# Patient Record
Sex: Female | Born: 1985 | Race: Black or African American | Marital: Single | Smoking: Never smoker
Health system: Southern US, Community
[De-identification: ages and names within clinical notes are randomized; demographics above are authoritative.]

## PROBLEM LIST (undated history)

## (undated) DIAGNOSIS — O1490 Unspecified pre-eclampsia, unspecified trimester: Secondary | ICD-10-CM

## (undated) HISTORY — PX: NO PAST SURGERIES: SHX2092

## (undated) HISTORY — DX: Unspecified pre-eclampsia, unspecified trimester: O14.90

---

## 2015-01-11 NOTE — L&D Delivery Note (Signed)
Delivery Note At 2:07 AM a viable female was delivered via Vaginal, Spontaneous Delivery (Presentation:OA ). Mother was induced with cytotec & pitocin.   APGAR: 8,9.  Placenta status: delivered spontaneously intact. Cord: 3 vessel, without complications.  Anesthesia:  Epidural   Episiotomy: None Lacerations: 2nd degree, perineal Suture Repair: 3.0 Est. Blood Loss (mL):100    Mom to postpartum.  Baby to Couplet care / Skin to Skin. All instruments and gauze accounted for and sharps disposed of.  Andres Ege, MD, PGY-1, MPH 08/15/2015, 2:27 AM   OB FELLOW DELIVERY ATTESTATION  I was gloved and present for the delivery in its entirety, and I agree with the above resident's note.    Ernestina Penna, MD 6:23 AM

## 2015-05-20 ENCOUNTER — Encounter: Payer: Self-pay | Admitting: General Practice

## 2015-05-20 ENCOUNTER — Encounter: Payer: Self-pay | Admitting: Advanced Practice Midwife

## 2015-05-20 ENCOUNTER — Ambulatory Visit (INDEPENDENT_AMBULATORY_CARE_PROVIDER_SITE_OTHER): Payer: Self-pay | Admitting: Advanced Practice Midwife

## 2015-05-20 VITALS — BP 137/84 | HR 122 | Ht 63.0 in

## 2015-05-20 DIAGNOSIS — Z124 Encounter for screening for malignant neoplasm of cervix: Secondary | ICD-10-CM

## 2015-05-20 DIAGNOSIS — Z113 Encounter for screening for infections with a predominantly sexual mode of transmission: Secondary | ICD-10-CM

## 2015-05-20 DIAGNOSIS — O162 Unspecified maternal hypertension, second trimester: Secondary | ICD-10-CM

## 2015-05-20 DIAGNOSIS — Z3689 Encounter for other specified antenatal screening: Secondary | ICD-10-CM

## 2015-05-20 DIAGNOSIS — Z3492 Encounter for supervision of normal pregnancy, unspecified, second trimester: Secondary | ICD-10-CM

## 2015-05-20 DIAGNOSIS — O0990 Supervision of high risk pregnancy, unspecified, unspecified trimester: Secondary | ICD-10-CM | POA: Insufficient documentation

## 2015-05-20 LAB — POCT URINALYSIS DIP (DEVICE)
Bilirubin Urine: NEGATIVE
GLUCOSE, UA: NEGATIVE mg/dL
Ketones, ur: NEGATIVE mg/dL
NITRITE: NEGATIVE
PROTEIN: NEGATIVE mg/dL
SPECIFIC GRAVITY, URINE: 1.025 (ref 1.005–1.030)
UROBILINOGEN UA: 0.2 mg/dL (ref 0.0–1.0)
pH: 6.5 (ref 5.0–8.0)

## 2015-05-20 LAB — COMPREHENSIVE METABOLIC PANEL
ALK PHOS: 58 U/L (ref 33–115)
ALT: 17 U/L (ref 6–29)
AST: 16 U/L (ref 10–30)
Albumin: 3.7 g/dL (ref 3.6–5.1)
BILIRUBIN TOTAL: 0.2 mg/dL (ref 0.2–1.2)
BUN: 6 mg/dL — AB (ref 7–25)
CO2: 24 mmol/L (ref 20–31)
CREATININE: 0.65 mg/dL (ref 0.50–1.10)
Calcium: 9.2 mg/dL (ref 8.6–10.2)
Chloride: 104 mmol/L (ref 98–110)
Glucose, Bld: 119 mg/dL — ABNORMAL HIGH (ref 65–99)
Potassium: 4.3 mmol/L (ref 3.5–5.3)
Sodium: 138 mmol/L (ref 135–146)
Total Protein: 6.8 g/dL (ref 6.1–8.1)

## 2015-05-20 LAB — GLUCOSE TOLERANCE, 1 HOUR (50G) W/O FASTING: Glucose, 1 Hr, gestational: 120 mg/dL (ref <140)

## 2015-05-20 NOTE — Progress Notes (Signed)
New ob packet given  Early glucola given due to BMI > 30 Anatomy May 18th @ 1400.  Pt notified.

## 2015-05-20 NOTE — Progress Notes (Signed)
   Subjective:    Jacqueline [name] is a G1P0 37w1dbeing seen today for her first obstetrical visit.  Her obstetrical history is significant for none G1.  [place] Pregnancy history fully reviewed.  Patient reports no complaints.  Filed Vitals:   05/20/15 0818 05/20/15 0820 05/20/15 0823 05/20/15 0905  BP: 147/94  148/89 137/84  Pulse: 122     Height:  '5\' 3"'$  (1.6 m)      HISTORY: OB History  Gravida Para Term Preterm AB SAB TAB Ectopic Multiple Living  1             # Outcome Date GA Lbr Len/2nd Weight Sex Delivery Anes PTL Lv  1 Current              History reviewed. No pertinent past medical history. History reviewed. No pertinent past surgical history. History reviewed. No pertinent family history.   Exam    Uterus:  Fundal Height: 25 cm  Pelvic Exam:    Perineum: No Hemorrhoids, Normal Perineum   Vulva: normal   Vagina:  normal mucosa, normal discharge   pH:    Cervix: no lesions and nulliparous appearance   Adnexa: normal adnexa and no mass, fullness, tenderness   Bony Pelvis: average  System: Breast:  normal appearance, no masses or tenderness   Skin: normal coloration and turgor, no rashes    Neurologic: oriented, normal, normal mood, gait normal; reflexes normal and symmetric   Extremities: normal strength, tone, and muscle mass, ROM of all joints is normal   HEENT sclera clear, anicteric, neck supple with midline trachea and thyroid without masses   Mouth/Teeth mucous membranes moist, pharynx normal without lesions and dental hygiene good   Neck supple and no masses   Cardiovascular:    Respiratory:  appears well, vitals normal, no respiratory distress, acyanotic, normal RR, ear and throat exam is normal, neck free of mass or lymphadenopathy   Abdomen: soft, non-tender; bowel sounds normal; no masses,  no organomegaly   Urinary: urethral meatus normal      Assessment:    Pregnancy: G1P0  1. Supervision  of low-risk pregnancy, second trimester  - Glucose Tolerance, 1 HR (50g) w/o Fasting - Prenatal Profile - Prescript Monitor Profile(19) - Cytology - PAP - GC/Chlamydia probe amp ([place])not at [facility] 2. Encounter for fetal anatomic survey  - [place] OB COMP + 14 WK; Future  3. Hypertension in pregnancy, antepartum, second trimester --Transient, with 2 elevated BPs, normal BP on retake.  Pt denies h/a, epigastric pain, visual disturbances. Labs drawn, preeclampsia precautions given.   --BP check in 2 weeks, ROB in 4 weeks if normal. - Comp Met (CMET) - Protein / creatinine ratio, urine      Plan:     Initial labs drawn. Prenatal vitamins. Problem list reviewed and updated. Genetic Screening discussed : late.  Ultrasound discussed; fetal survey: ordered.  Follow up in 2 weeks for BP check, 4 weeks for ROB    LEFTWICH-KIRBY, Yaritsa Savarino 05/20/2015

## 2015-05-20 NOTE — Patient Instructions (Addendum)

## 2015-05-21 LAB — PRENATAL PROFILE (SOLSTAS)
Antibody Screen: NEGATIVE
Basophils Absolute: 0 cells/uL (ref 0–200)
Basophils Relative: 0 %
Eosinophils Absolute: 144 cells/uL (ref 15–500)
Eosinophils Relative: 2 %
HCT: 34.9 % — ABNORMAL LOW (ref 35.0–45.0)
HIV: NONREACTIVE
Hemoglobin: 11.9 g/dL (ref 11.7–15.5)
Hepatitis B Surface Ag: NEGATIVE
LYMPHS PCT: 22 %
Lymphs Abs: 1584 cells/uL (ref 850–3900)
MCH: 29.7 pg (ref 27.0–33.0)
MCHC: 34.1 g/dL (ref 32.0–36.0)
MCV: 87 fL (ref 80.0–100.0)
MONO ABS: 504 {cells}/uL (ref 200–950)
MONOS PCT: 7 %
MPV: 9.1 fL (ref 7.5–12.5)
NEUTROS PCT: 69 %
Neutro Abs: 4968 cells/uL (ref 1500–7800)
PLATELETS: 226 10*3/uL (ref 140–400)
RBC: 4.01 MIL/uL (ref 3.80–5.10)
RDW: 14 % (ref 11.0–15.0)
RH TYPE: POSITIVE
Rubella: <0.9 Index (ref <0.90)
WBC: 7.2 10*3/uL (ref 3.8–10.8)

## 2015-05-21 LAB — GC/CHLAMYDIA PROBE AMP (~~LOC~~) NOT AT ARMC
Chlamydia: NEGATIVE
NEISSERIA GONORRHEA: NEGATIVE

## 2015-05-21 LAB — PROTEIN / CREATININE RATIO, URINE
CREATININE, URINE: 190 mg/dL (ref 20–320)
Protein Creatinine Ratio: 174 mg/g creat — ABNORMAL HIGH (ref 21–161)
Total Protein, Urine: 33 mg/dL — ABNORMAL HIGH (ref 5–24)

## 2015-05-21 LAB — CYTOLOGY - PAP

## 2015-05-22 LAB — PRESCRIPTION MONITORING PROFILE (19 PANEL)
Amphetamine/Meth: NEGATIVE ng/mL
BENZODIAZEPINE SCREEN, URINE: NEGATIVE ng/mL
Barbiturate Screen, Urine: NEGATIVE ng/mL
Buprenorphine, Urine: NEGATIVE ng/mL
CANNABINOID SCRN UR: NEGATIVE ng/mL
COCAINE METABOLITES: NEGATIVE ng/mL
CREATININE, URINE: 186.15 mg/dL (ref >20.0)
Carisoprodol, Urine: NEGATIVE ng/mL
ECSTASY: NEGATIVE ng/mL
FENTANYL URINE: NEGATIVE ng/mL
Meperidine, Ur: NEGATIVE ng/mL
Methadone Screen, Urine: NEGATIVE ng/mL
Methaqualone: NEGATIVE ng/mL
Nitrites, Initial: NEGATIVE ug/mL
OXYCODONE SCRN UR: NEGATIVE ng/mL
Opiate Screen, Urine: NEGATIVE ng/mL
PH URINE, INITIAL: 7 pH (ref 4.5–8.9)
PHENCYCLIDINE, UR: NEGATIVE ng/mL
Propoxyphene: NEGATIVE ng/mL
TRAMADOL UR: NEGATIVE ng/mL
Tapentadol, urine: NEGATIVE ng/mL
ZOLPIDEM, URINE: NEGATIVE ng/mL

## 2015-05-27 ENCOUNTER — Encounter: Payer: Self-pay | Admitting: Obstetrics and Gynecology

## 2015-05-27 DIAGNOSIS — Z283 Underimmunization status: Secondary | ICD-10-CM | POA: Insufficient documentation

## 2015-05-27 DIAGNOSIS — O9989 Other specified diseases and conditions complicating pregnancy, childbirth and the puerperium: Secondary | ICD-10-CM

## 2015-05-27 DIAGNOSIS — Z2839 Other underimmunization status: Secondary | ICD-10-CM | POA: Insufficient documentation

## 2015-05-28 ENCOUNTER — Ambulatory Visit (HOSPITAL_COMMUNITY)
Admission: RE | Admit: 2015-05-28 | Discharge: 2015-05-28 | Disposition: A | Discharge status: Home / Self Care | Payer: Self-pay | Source: Ambulatory Visit | Attending: Advanced Practice Midwife | Admitting: Advanced Practice Midwife

## 2015-05-28 DIAGNOSIS — Z3689 Encounter for other specified antenatal screening: Secondary | ICD-10-CM

## 2015-05-28 DIAGNOSIS — Z36 Encounter for antenatal screening of mother: Secondary | ICD-10-CM | POA: Insufficient documentation

## 2015-05-28 DIAGNOSIS — Z3A26 26 weeks gestation of pregnancy: Secondary | ICD-10-CM | POA: Insufficient documentation

## 2015-05-28 DIAGNOSIS — Z3492 Encounter for supervision of normal pregnancy, unspecified, second trimester: Secondary | ICD-10-CM

## 2015-06-03 ENCOUNTER — Ambulatory Visit (INDEPENDENT_AMBULATORY_CARE_PROVIDER_SITE_OTHER): Payer: Self-pay | Admitting: *Deleted

## 2015-06-03 VITALS — BP 124/82 | HR 103

## 2015-06-03 DIAGNOSIS — O132 Gestational [pregnancy-induced] hypertension without significant proteinuria, second trimester: Secondary | ICD-10-CM

## 2015-06-03 NOTE — Progress Notes (Signed)
Pt denies H/A, visual disturbances or epigastric pain. Pt advised to come to hospital if these sx should occur. She reported having constipation and hemorrhoids. OTC meds which are acceptable were discussed. Pt voiced understanding of all information and instructions given.

## 2015-06-17 ENCOUNTER — Encounter: Payer: Self-pay | Admitting: Obstetrics and Gynecology

## 2015-06-17 ENCOUNTER — Ambulatory Visit (INDEPENDENT_AMBULATORY_CARE_PROVIDER_SITE_OTHER): Payer: Self-pay | Admitting: Obstetrics and Gynecology

## 2015-06-17 VITALS — BP 138/76 | HR 125 | Wt 203.8 lb

## 2015-06-17 DIAGNOSIS — Z3493 Encounter for supervision of normal pregnancy, unspecified, third trimester: Secondary | ICD-10-CM

## 2015-06-17 LAB — POCT URINALYSIS DIP (DEVICE)
Bilirubin Urine: NEGATIVE
Glucose, UA: NEGATIVE mg/dL
KETONES UR: NEGATIVE mg/dL
Nitrite: NEGATIVE
Protein, ur: 30 mg/dL — AB
SPECIFIC GRAVITY, URINE: 1.025 (ref 1.005–1.030)
UROBILINOGEN UA: 0.2 mg/dL (ref 0.0–1.0)
pH: 6.5 (ref 5.0–8.0)

## 2015-06-17 NOTE — Progress Notes (Signed)
Subjective:  Jacqueline [name] is a 30 y.o. G1P0 at 7134w1d being seen today for ongoing prenatal care.  She is currently monitored for the following issues for this low-risk pregnancy and has Supervision of low-risk pregnancy and Rubella non-immune status, antepartum on her problem list.  Patient reports no complaints. Mild CTS sx on left. No H/A, visual sx Contractions: Not present. Vag. Bleeding: None.  Movement: Present. Denies leaking of fluid.   The following portions of the patient's history were reviewed and updated as appropriate: allergies, current medications, past family history, past medical history, past social history, past surgical history and problem list. Problem list updated.  Objective:   Filed Vitals:   06/17/15 0922  BP: 138/76  Pulse: 125  Weight: 203 lb 12.8 oz (92.443 kg)    Fetal Status: Fetal Heart Rate (bpm): 140   Movement: Present     General:  Alert, oriented and cooperative. Patient is in no acute distress.  Skin: Skin is warm and dry. No rash noted.   Cardiovascular: Normal heart rate noted  Respiratory: Normal respiratory effort, no problems with respiration noted  Abdomen: Soft, gravid, appropriate for gestational age. Pain/Pressure: Present     Pelvic: Vag. Bleeding: None     Cervical exam deferred        Extremities: Normal range of motion.  Edema: Trace  Mental Status: Normal mood and affect. Normal behavior. Normal judgment and thought content.   Urinalysis: Urine Protein: 1+ Urine Glucose: Negative  Assessment and Plan:  Pregnancy: G1P0 at 5634w1d  There are no diagnoses linked to this encounter. Preterm labor symptoms and general obstetric precautions including but not limited to vaginal bleeding, contractions, leaking of fluid and fetal movement were reviewed in detail with the patient. Please refer to After Visit Summary for other counseling recommendations.  Watch size/dates Return in about 2 weeks (around 07/01/2015).   Danae Orleanseirdre C Khoi Hamberger,  CNM

## 2015-06-17 NOTE — Patient Instructions (Addendum)
Third Trimester of Pregnancy The third trimester is from week 29 through week 42, months 7 through 9. The third trimester is a time when the fetus is growing rapidly. At the end of the ninth month, the fetus is about 20 inches in length and weighs 6-10 pounds.  BODY CHANGES Your body goes through many changes during pregnancy. The changes vary from woman to woman.   Your weight will continue to increase. You can expect to gain 25-35 pounds (11-16 kg) by the end of the pregnancy.  You may begin to get stretch marks on your hips, abdomen, and breasts.  You may urinate more often because the fetus is moving lower into your pelvis and pressing on your bladder.  You may develop or continue to have heartburn as a result of your pregnancy.  You may develop constipation because certain hormones are causing the muscles that push waste through your intestines to slow down.  You may develop hemorrhoids or swollen, bulging veins (varicose veins).  You may have pelvic pain because of the weight gain and pregnancy hormones relaxing your joints between the bones in your pelvis. Backaches may result from overexertion of the muscles supporting your posture.  You may have changes in your hair. These can include thickening of your hair, rapid growth, and changes in texture. Some women also have hair loss during or after pregnancy, or hair that feels dry or thin. Your hair will most likely return to normal after your baby is born.  Your breasts will continue to grow and be tender. A yellow discharge may leak from your breasts called colostrum.  Your belly button may stick out.  You may feel short of breath because of your expanding uterus.  You may notice the fetus "dropping," or moving lower in your abdomen.  You may have a bloody mucus discharge. This usually occurs a few days to a week before labor begins.  Your cervix becomes thin and soft (effaced) near your due date. WHAT TO EXPECT AT YOUR PRENATAL  EXAMS  You will have prenatal exams every 2 weeks until week 36. Then, you will have weekly prenatal exams. During a routine prenatal visit:  You will be weighed to make sure you and the fetus are growing normally.  Your blood pressure is taken.  Your abdomen will be measured to track your baby's growth.  The fetal heartbeat will be listened to.  Any test results from the previous visit will be discussed.  You may have a cervical check near your due date to see if you have effaced. At around 36 weeks, your caregiver will check your cervix. At the same time, your caregiver will also perform a test on the secretions of the vaginal tissue. This test is to determine if a type of bacteria, Group B streptococcus, is present. Your caregiver will explain this further. Your caregiver may ask you:  What your birth plan is.  How you are feeling.  If you are feeling the baby move.  If you have had any abnormal symptoms, such as leaking fluid, bleeding, severe headaches, or abdominal cramping.  If you are using any tobacco products, including cigarettes, chewing tobacco, and electronic cigarettes.  If you have any questions. Other tests or screenings that may be performed during your third trimester include:  Blood tests that check for low iron levels (anemia).  Fetal testing to check the health, activity level, and growth of the fetus. Testing is done if you have certain medical conditions or if   there are problems during the pregnancy.  HIV (human immunodeficiency virus) testing. If you are at high risk, you may be screened for HIV during your third trimester of pregnancy. FALSE LABOR You may feel small, irregular contractions that eventually go away. These are called Braxton Hicks contractions, or false labor. Contractions may last for hours, days, or even weeks before true labor sets in. If contractions come at regular intervals, intensify, or become painful, it is best to be seen by your  caregiver.  SIGNS OF LABOR   Menstrual-like cramps.  Contractions that are 5 minutes apart or less.  Contractions that start on the top of the uterus and spread down to the lower abdomen and back.  A sense of increased pelvic pressure or back pain.  A watery or bloody mucus discharge that comes from the vagina. If you have any of these signs before the 37th week of pregnancy, call your caregiver right away. You need to go to the hospital to get checked immediately. HOME CARE INSTRUCTIONS   Avoid all smoking, herbs, alcohol, and unprescribed drugs. These chemicals affect the formation and growth of the baby.  Do not use any tobacco products, including cigarettes, chewing tobacco, and electronic cigarettes. If you need help quitting, ask your health care provider. You may receive counseling support and other resources to help you quit.  Follow your caregiver's instructions regarding medicine use. There are medicines that are either safe or unsafe to take during pregnancy.  Exercise only as directed by your caregiver. Experiencing uterine cramps is a good sign to stop exercising.  Continue to eat regular, healthy meals.  Wear a good support bra for breast tenderness.  Do not use hot tubs, steam rooms, or saunas.  Wear your seat belt at all times when driving.  Avoid raw meat, uncooked cheese, cat litter boxes, and soil used by cats. These carry germs that can cause birth defects in the baby.  Take your prenatal vitamins.  Take 1500-2000 mg of calcium daily starting at the 20th week of pregnancy until you deliver your baby.  Try taking a stool softener (if your caregiver approves) if you develop constipation. Eat more high-fiber foods, such as fresh vegetables or fruit and whole grains. Drink plenty of fluids to keep your urine clear or pale yellow.  Take warm sitz baths to soothe any pain or discomfort caused by hemorrhoids. Use hemorrhoid cream if your caregiver approves.  If  you develop varicose veins, wear support hose. Elevate your feet for 15 minutes, 3-4 times a day. Limit salt in your diet.  Avoid heavy lifting, wear low heal shoes, and practice good posture.  Rest a lot with your legs elevated if you have leg cramps or low back pain.  Visit your dentist if you have not gone during your pregnancy. Use a soft toothbrush to brush your teeth and be gentle when you floss.  A sexual relationship may be continued unless your caregiver directs you otherwise.  Do not travel far distances unless it is absolutely necessary and only with the approval of your caregiver.  Take prenatal classes to understand, practice, and ask questions about the labor and delivery.  Make a trial run to the hospital.  Pack your hospital bag.  Prepare the baby's nursery.  Continue to go to all your prenatal visits as directed by your caregiver. SEEK MEDICAL CARE IF:  You are unsure if you are in labor or if your water has broken.  You have dizziness.  You have  mild pelvic cramps, pelvic pressure, or nagging pain in your abdominal area.  You have persistent nausea, vomiting, or diarrhea.  You have a bad smelling vaginal discharge.  You have pain with urination. SEEK IMMEDIATE MEDICAL CARE IF:   You have a fever.  You are leaking fluid from your vagina.  You have spotting or bleeding from your vagina.  You have severe abdominal cramping or pain.  You have rapid weight loss or gain.  You have shortness of breath with chest pain.  You notice sudden or extreme swelling of your face, hands, ankles, feet, or legs.  You have not felt your baby move in over an hour.  You have severe headaches that do not go away with medicine.  You have vision changes.   This information is not intended to replace advice given to you by your health care provider. Make sure you discuss any questions you have with your health care provider.   Document Released: 12/21/2000 Document  Revised: 01/17/2014 Document Reviewed: 02/28/2012 Elsevier Interactive Patient Education 2016 [employer] Carpal Tunnel Syndrome Carpal tunnel syndrome is a condition that causes pain in your hand and arm. The carpal tunnel is a narrow area located on the palm side of your wrist. Repeated wrist motion or certain diseases may cause swelling within the tunnel. This swelling pinches the main nerve in the wrist (median nerve). CAUSES  This condition may be caused by:   Repeated wrist motions.  Wrist injuries.  Arthritis.  A cyst or tumor in the carpal tunnel.  Fluid buildup during pregnancy. Sometimes the cause of this condition is not known.  RISK FACTORS This condition is more likely to develop in:   People who have jobs that cause them to repeatedly move their wrists in the same motion, such as butchers and cashiers.  Women.  People with certain conditions, such as:  Diabetes.  Obesity.  An underactive thyroid (hypothyroidism).  Kidney failure. SYMPTOMS  Symptoms of this condition include:   A tingling feeling in your fingers, especially in your thumb, index, and middle fingers.  Tingling or numbness in your hand.  An aching feeling in your entire arm, especially when your wrist and elbow are bent for long periods of time.  Wrist pain that goes up your arm to your shoulder.  Pain that goes down into your palm or fingers.  A weak feeling in your hands. You may have trouble grabbing and holding items. Your symptoms may feel worse during the night.  DIAGNOSIS  This condition is diagnosed with a medical history and physical exam. You may also have tests, including:   An electromyogram (EMG). This test measures electrical signals sent by your nerves into the muscles.  X-rays. TREATMENT  Treatment for this condition includes:  Lifestyle changes. It is important to stop doing or modify the activity that caused your condition.  Physical or occupational  therapy.  Medicines for pain and inflammation. This may include medicine that is injected into your wrist.  A wrist splint.  Surgery. HOME CARE INSTRUCTIONS  If You Have a Splint:  Wear it as told by your health care provider. Remove it only as told by your health care provider.  Loosen the splint if your fingers become numb and tingle, or if they turn cold and blue.  Keep the splint clean and dry. General Instructions  Take over-the-counter and prescription medicines only as told by your health care provider.  Rest your wrist from any activity that may be causing your  pain. If your condition is work related, talk to your employer about changes that can be made, such as getting a wrist pad to use while typing.  If directed, apply ice to the painful area:  Put ice in a plastic bag.  Place a towel between your skin and the bag.  Leave the ice on for 20 minutes, 2-3 times per day.  Keep all follow-up visits as told by your health care provider. This is important.  Do any exercises as told by your health care provider, physical therapist, or occupational therapist. SEEK MEDICAL CARE IF:   You have new symptoms.  Your pain is not controlled with medicines.  Your symptoms get worse.   This information is not intended to replace advice given to you by your health care provider. Make sure you discuss any questions you have with your health care provider.   Document Released: 12/25/1999 Document Revised: 09/17/2014 Document Reviewed: 05/14/2014 Elsevier Interactive Patient Education [employer]

## 2015-07-01 ENCOUNTER — Ambulatory Visit (INDEPENDENT_AMBULATORY_CARE_PROVIDER_SITE_OTHER): Payer: Self-pay | Admitting: Advanced Practice Midwife

## 2015-07-01 VITALS — BP 123/85 | HR 120 | Wt 204.0 lb

## 2015-07-01 DIAGNOSIS — R109 Unspecified abdominal pain: Secondary | ICD-10-CM

## 2015-07-01 DIAGNOSIS — Z3493 Encounter for supervision of normal pregnancy, unspecified, third trimester: Secondary | ICD-10-CM

## 2015-07-01 DIAGNOSIS — O26899 Other specified pregnancy related conditions, unspecified trimester: Secondary | ICD-10-CM

## 2015-07-01 DIAGNOSIS — O9989 Other specified diseases and conditions complicating pregnancy, childbirth and the puerperium: Secondary | ICD-10-CM

## 2015-07-01 LAB — POCT URINALYSIS DIP (DEVICE)
Bilirubin Urine: NEGATIVE
GLUCOSE, UA: NEGATIVE mg/dL
Ketones, ur: NEGATIVE mg/dL
Nitrite: NEGATIVE
PH: 6.5 (ref 5.0–8.0)
PROTEIN: NEGATIVE mg/dL
SPECIFIC GRAVITY, URINE: 1.025 (ref 1.005–1.030)
Urobilinogen, UA: 0.2 mg/dL (ref 0.0–1.0)

## 2015-07-01 NOTE — Progress Notes (Signed)
Subjective:  Jacqueline [name] is a 30 y.o. G1P0 at 724w1d being seen today for ongoing prenatal care.  She is currently monitored for the following issues for this low-risk pregnancy and has Supervision of low-risk pregnancy and Rubella non-immune status, antepartum on her problem list.  Patient reports pt reports intermittent pain in her right upper abdomen below her ribs, described as soreness, not associated with eating, no n/v with pain.  Contractions: Not present. Vag. Bleeding: None.  Movement: Present. Denies leaking of fluid.   The following portions of the patient's history were reviewed and updated as appropriate: allergies, current medications, past family history, past medical history, past social history, past surgical history and problem list. Problem list updated.  Objective:   Filed Vitals:   07/01/15 1038  BP: 123/85  Pulse: 120  Weight: 204 lb (92.534 kg)    Fetal Status: Fetal Heart Rate (bpm): 141   Movement: Present     General:  Alert, oriented and cooperative. Patient is in no acute distress.  Skin: Skin is warm and dry. No rash noted.   Cardiovascular: Normal heart rate noted  Respiratory: Normal respiratory effort, no problems with respiration noted  Abdomen: Soft, gravid, appropriate for gestational age. Pain/Pressure: Present     Pelvic: Cervical exam deferred        Extremities: Normal range of motion.     Mental Status: Normal mood and affect. Normal behavior. Normal judgment and thought content.   Urinalysis:      Assessment and Plan:  Pregnancy: G1P0 at 4224w1d  1. Supervision of low-risk pregnancy, third trimester   2. Abdominal pain affecting pregnancy, antepartum --BP wnl, no h/a, epigastric pain, or visual disturbances --Palpable soreness of right upper abdomen, fetal part (knee?) palpable at area causing pain.  Pt also reports her underwire bra causes pain at her ribs so discussed changing bra to see if improvement.  Also recommend hands and knees  positioning and pelvic tilts to move baby from uncomfortable position.    Preterm labor symptoms and general obstetric precautions including but not limited to vaginal bleeding, contractions, leaking of fluid and fetal movement were reviewed in detail with the patient. Please refer to After Visit Summary for other counseling recommendations.  Return in about 2 days (around 07/03/2015).   Hurshel PartyLisa A Leftwich-Kirby, CNM

## 2015-07-27 ENCOUNTER — Ambulatory Visit (INDEPENDENT_AMBULATORY_CARE_PROVIDER_SITE_OTHER): Payer: Self-pay | Admitting: Obstetrics & Gynecology

## 2015-07-27 VITALS — BP 129/85 | HR 133 | Wt 206.4 lb

## 2015-07-27 DIAGNOSIS — O133 Gestational [pregnancy-induced] hypertension without significant proteinuria, third trimester: Secondary | ICD-10-CM

## 2015-07-27 LAB — CBC
HEMATOCRIT: 36.9 % (ref 35.0–45.0)
Hemoglobin: 12.2 g/dL (ref 11.7–15.5)
MCH: 28.7 pg (ref 27.0–33.0)
MCHC: 33.1 g/dL (ref 32.0–36.0)
MCV: 86.8 fL (ref 80.0–100.0)
MPV: 9.4 fL (ref 7.5–12.5)
Platelets: 247 10*3/uL (ref 140–400)
RBC: 4.25 MIL/uL (ref 3.80–5.10)
RDW: 14.7 % (ref 11.0–15.0)
WBC: 9.1 10*3/uL (ref 3.8–10.8)

## 2015-07-27 LAB — POCT URINALYSIS DIP (DEVICE)
Glucose, UA: NEGATIVE mg/dL
KETONES UR: 15 mg/dL — AB
Nitrite: NEGATIVE
PH: 6 (ref 5.0–8.0)
PROTEIN: 30 mg/dL — AB
SPECIFIC GRAVITY, URINE: 1.02 (ref 1.005–1.030)
UROBILINOGEN UA: 1 mg/dL (ref 0.0–1.0)

## 2015-07-27 LAB — COMPREHENSIVE METABOLIC PANEL
ALK PHOS: 125 U/L — AB (ref 33–115)
ALT: 10 U/L (ref 6–29)
AST: 16 U/L (ref 10–30)
Albumin: 3.3 g/dL — ABNORMAL LOW (ref 3.6–5.1)
BUN: 6 mg/dL — ABNORMAL LOW (ref 7–25)
CALCIUM: 9.2 mg/dL (ref 8.6–10.2)
CHLORIDE: 105 mmol/L (ref 98–110)
CO2: 21 mmol/L (ref 20–31)
Creat: 0.75 mg/dL (ref 0.50–1.10)
GLUCOSE: 87 mg/dL (ref 65–99)
POTASSIUM: 4 mmol/L (ref 3.5–5.3)
Sodium: 138 mmol/L (ref 135–146)
Total Bilirubin: 0.5 mg/dL (ref 0.2–1.2)
Total Protein: 6.3 g/dL (ref 6.1–8.1)

## 2015-07-27 NOTE — Progress Notes (Signed)
Subjective:  Jacqueline [name] is a 30 y.o. G1P0 at 78w6dbeing seen today for ongoing prenatal care.  She is currently monitored for the following issues for this high-risk pregnancy and has newly diagnosed gestational hypertension.  Patient reports no complaints.  Contractions: Not present. Vag. Bleeding: None.  Movement: Present. Denies leaking of fluid.   The following portions of the patient's history were reviewed and updated as appropriate: allergies, current medications, past family history, past medical history, past social history, past surgical history and problem list. Problem list updated.  Objective:   Filed Vitals:   07/27/15 0947 07/27/15 0949  BP: 140/92 129/85  Pulse: 109 133  Weight: 206 lb 6.4 oz (93.622 kg)     Fetal Status: Fetal Heart Rate (bpm): 159   Movement: Present     General:  Alert, oriented and cooperative. Patient is in no acute distress.  Skin: Skin is warm and dry. No rash noted.   Cardiovascular: Normal heart rate noted  Respiratory: Normal respiratory effort, no problems with respiration noted  Abdomen: Soft, gravid, appropriate for gestational age. Pain/Pressure: Present     Pelvic:  Cervical exam deferred        Extremities: Normal range of motion.  Edema: Trace  Mental Status: Normal mood and affect. Normal behavior. Normal judgment and thought content.   Urinalysis: Urine Protein: 1+ Urine Glucose: Negative  Assessment and Plan:  Pregnancy: G1P0 at 338w6d1. Gestational hypertension without significant proteinuria in third trimester - Pt thinks her BP is elevated b/c she is nervous.  Goes down after being in teh office for 5-10 minutes.  She thinks the lack of prenatal care has caused harm.  She denies all PIH symptoms.  However, there have been several mildly elevated pressures, including one today.  Pt worried about cost of tests.  She agrees to do the labs but does not want to do an [place] growth and f/u anatomy.  She also needs twice weekly  testing which she does not want to do today either.   - Protein / creatinine ratio, urine - CBC - Comp Met (CMET) - [place] Follow Up; Future - 2x weekly testing.  Preterm labor symptoms and general obstetric precautions including but not limited to vaginal bleeding, contractions, leaking of fluid and fetal movement were reviewed in detail with the patient. Please refer to After Visit Summary for other counseling recommendations.  Return in about 1 week (around 08/03/2015).   KeGuss BundeMD

## 2015-07-27 NOTE — Progress Notes (Signed)
B/P 120/72 RIGHT ARM

## 2015-07-27 NOTE — Patient Instructions (Signed)

## 2015-07-28 LAB — PROTEIN / CREATININE RATIO, URINE
Creatinine, Urine: 216 mg/dL (ref 20–320)
PROTEIN CREATININE RATIO: 259 mg/g{creat} — AB (ref 21–161)
Total Protein, Urine: 56 mg/dL — ABNORMAL HIGH (ref 5–24)

## 2015-07-30 ENCOUNTER — Other Ambulatory Visit: Payer: Self-pay

## 2015-07-30 ENCOUNTER — Ambulatory Visit (HOSPITAL_COMMUNITY)
Admission: RE | Admit: 2015-07-30 | Discharge: 2015-07-30 | Disposition: A | Discharge status: Home / Self Care | Payer: Self-pay | Source: Ambulatory Visit | Attending: Obstetrics & Gynecology | Admitting: Obstetrics & Gynecology

## 2015-07-30 ENCOUNTER — Ambulatory Visit (INDEPENDENT_AMBULATORY_CARE_PROVIDER_SITE_OTHER): Payer: Self-pay | Admitting: Obstetrics & Gynecology

## 2015-07-30 ENCOUNTER — Other Ambulatory Visit: Payer: Self-pay | Admitting: Obstetrics & Gynecology

## 2015-07-30 ENCOUNTER — Encounter (HOSPITAL_COMMUNITY): Payer: Self-pay

## 2015-07-30 VITALS — BP 125/75 | HR 131 | Wt 206.4 lb

## 2015-07-30 VITALS — BP 125/81 | HR 108

## 2015-07-30 DIAGNOSIS — O133 Gestational [pregnancy-induced] hypertension without significant proteinuria, third trimester: Secondary | ICD-10-CM

## 2015-07-30 DIAGNOSIS — Z3A35 35 weeks gestation of pregnancy: Secondary | ICD-10-CM

## 2015-07-30 DIAGNOSIS — O3480 Maternal care for other abnormalities of pelvic organs, unspecified trimester: Secondary | ICD-10-CM

## 2015-07-30 DIAGNOSIS — O3483 Maternal care for other abnormalities of pelvic organs, third trimester: Secondary | ICD-10-CM

## 2015-07-30 DIAGNOSIS — N83209 Unspecified ovarian cyst, unspecified side: Secondary | ICD-10-CM | POA: Insufficient documentation

## 2015-07-30 NOTE — Progress Notes (Signed)
NST reactive today.

## 2015-07-31 NOTE — Addendum Note (Signed)
Addended by: Garret ReddishBARNES, Jalon Squier M on: 07/31/2015 12:37 PM   Modules accepted: Orders

## 2015-08-03 ENCOUNTER — Ambulatory Visit (INDEPENDENT_AMBULATORY_CARE_PROVIDER_SITE_OTHER): Payer: Self-pay | Admitting: Obstetrics & Gynecology

## 2015-08-03 VITALS — BP 128/69 | HR 104 | Wt 206.0 lb

## 2015-08-03 DIAGNOSIS — O133 Gestational [pregnancy-induced] hypertension without significant proteinuria, third trimester: Secondary | ICD-10-CM

## 2015-08-03 DIAGNOSIS — Z113 Encounter for screening for infections with a predominantly sexual mode of transmission: Secondary | ICD-10-CM

## 2015-08-03 DIAGNOSIS — Z3493 Encounter for supervision of normal pregnancy, unspecified, third trimester: Secondary | ICD-10-CM

## 2015-08-03 LAB — POCT URINALYSIS DIP (DEVICE)
GLUCOSE, UA: NEGATIVE mg/dL
KETONES UR: 15 mg/dL — AB
Nitrite: NEGATIVE
Protein, ur: 30 mg/dL — AB
UROBILINOGEN UA: 1 mg/dL (ref 0.0–1.0)
pH: 6.5 (ref 5.0–8.0)

## 2015-08-03 LAB — OB RESULTS CONSOLE GC/CHLAMYDIA: Gonorrhea: NEGATIVE

## 2015-08-03 LAB — OB RESULTS CONSOLE GBS: GBS: NEGATIVE

## 2015-08-03 NOTE — Progress Notes (Signed)
Subjective:  Jacqueline [name] is a 29 y.o. G1P0 at [redacted]w[redacted]d being seen today for ongoing prenatal care.  She is currently monitored for the following issues for this high-risk pregnancy and has Supervision of low-risk pregnancy; Rubella non-immune status, antepartum; and Ovarian cyst in pregnancy on her problem list.  Patient reports no complaints.  Contractions: Not present. Vag. Bleeding: None.  Movement: Present. Denies leaking of fluid.   The following portions of the patient's history were reviewed and updated as appropriate: allergies, current medications, past family history, past medical history, past social history, past surgical history and problem list. Problem list updated.  Objective:   Vitals:   08/03/15 1259  BP: 128/69  Pulse: (!) 104  Weight: 206 lb (93.4 kg)    Fetal Status: Fetal Heart Rate (bpm): NST Fundal Height: 37 cm Movement: Present     General:  Alert, oriented and cooperative. Patient is in no acute distress.  Skin: Skin is warm and dry. No rash noted.   Cardiovascular: Normal heart rate noted  Respiratory: Normal respiratory effort, no problems with respiration noted  Abdomen: Soft, gravid, appropriate for gestational age. Pain/Pressure: Present     Pelvic:  Cervical exam deferred        Extremities: Normal range of motion.  Edema: None  Mental Status: Normal mood and affect. Normal behavior. Normal judgment and thought content.   Urinalysis: Urine Protein: 1+ Urine Glucose: Negative  Assessment and Plan:  Pregnancy: G1P0 at [redacted]w[redacted]d  1. Gestational hypertension w/o significant proteinuria in 3rd trimester Reactive Nst - Fetal nonstress test - Culture, beta strep (group b only) - GC/Chlamydia probe amp ([place])not at [facility]  2. Supervision of low-risk pregnancy, third trimester BP wnl, followed for possible GHTN - Culture, beta strep (group b only) - GC/Chlamydia probe amp ([place])not at [facility]  3. Supervision of normal pregnancy, third  trimester Consider delivery 37-39 weeks  Preterm labor symptoms and general obstetric precautions including but not limited to vaginal bleeding, contractions, leaking of fluid and fetal movement were reviewed in detail with the patient. Please refer to After Visit Summary for other counseling recommendations.  Return in 3 days (on 08/06/2015) for NST.   Adam Phenix, MD

## 2015-08-03 NOTE — Patient Instructions (Signed)

## 2015-08-03 NOTE — Progress Notes (Signed)
US for growth done 7/20 

## 2015-08-04 LAB — PROTEIN, URINE, 24 HOUR
PROTEIN, URINE: 12 mg/dL (ref 5–24)
Protein, 24H Urine: 363 mg/24 h — ABNORMAL HIGH (ref <150)

## 2015-08-04 LAB — CREATININE CLEARANCE, URINE, 24 HOUR
CREATININE, URINE: 69 mg/dL (ref 20–320)
CREATININE: 0.75 mg/dL (ref 0.50–1.10)
Creatinine Clearance: 193 mL/min — ABNORMAL HIGH (ref 75–115)
Creatinine, 24H Ur: 2.09 g/(24.h) (ref 0.63–2.50)

## 2015-08-04 LAB — GC/CHLAMYDIA PROBE AMP (~~LOC~~) NOT AT ARMC
Chlamydia: NEGATIVE
Neisseria Gonorrhea: NEGATIVE

## 2015-08-05 LAB — CULTURE, BETA STREP (GROUP B ONLY)

## 2015-08-06 ENCOUNTER — Encounter: Payer: Self-pay | Admitting: *Deleted

## 2015-08-06 ENCOUNTER — Ambulatory Visit (INDEPENDENT_AMBULATORY_CARE_PROVIDER_SITE_OTHER): Payer: Self-pay | Admitting: Obstetrics & Gynecology

## 2015-08-06 VITALS — BP 122/86 | HR 103

## 2015-08-06 DIAGNOSIS — O1400 Mild to moderate pre-eclampsia, unspecified trimester: Secondary | ICD-10-CM | POA: Insufficient documentation

## 2015-08-06 DIAGNOSIS — O133 Gestational [pregnancy-induced] hypertension without significant proteinuria, third trimester: Secondary | ICD-10-CM

## 2015-08-10 ENCOUNTER — Ambulatory Visit (INDEPENDENT_AMBULATORY_CARE_PROVIDER_SITE_OTHER): Payer: Self-pay | Admitting: Obstetrics and Gynecology

## 2015-08-10 ENCOUNTER — Other Ambulatory Visit: Payer: Self-pay | Admitting: Obstetrics and Gynecology

## 2015-08-10 ENCOUNTER — Encounter: Payer: Self-pay | Admitting: Obstetrics and Gynecology

## 2015-08-10 VITALS — BP 135/84 | HR 91 | Wt 206.5 lb

## 2015-08-10 DIAGNOSIS — O0993 Supervision of high risk pregnancy, unspecified, third trimester: Secondary | ICD-10-CM

## 2015-08-10 DIAGNOSIS — O133 Gestational [pregnancy-induced] hypertension without significant proteinuria, third trimester: Secondary | ICD-10-CM

## 2015-08-10 DIAGNOSIS — O3483 Maternal care for other abnormalities of pelvic organs, third trimester: Secondary | ICD-10-CM

## 2015-08-10 DIAGNOSIS — O1403 Mild to moderate pre-eclampsia, third trimester: Secondary | ICD-10-CM

## 2015-08-10 DIAGNOSIS — N83209 Unspecified ovarian cyst, unspecified side: Secondary | ICD-10-CM

## 2015-08-10 LAB — POCT URINALYSIS DIP (DEVICE)
BILIRUBIN URINE: NEGATIVE
Glucose, UA: NEGATIVE mg/dL
KETONES UR: NEGATIVE mg/dL
Nitrite: NEGATIVE
PH: 6 (ref 5.0–8.0)
Protein, ur: NEGATIVE mg/dL
SPECIFIC GRAVITY, URINE: 1.01 (ref 1.005–1.030)
Urobilinogen, UA: 0.2 mg/dL (ref 0.0–1.0)

## 2015-08-10 NOTE — Progress Notes (Signed)
Pt denies H/A or visual disturbances.  

## 2015-08-10 NOTE — Progress Notes (Signed)
See H&P under induction orders.  rNST today with no decel and occasional UCs.

## 2015-08-10 NOTE — H&P (Signed)
Obstetrics Admission History & Physical  08/10/2015 - 4:29 PM Primary OBGYN: [facility]  Chief Complaint: IOL for pre-eclampsia w/o severe features  History of Present Illness  30 y.o. G1 @ [redacted]w[redacted]d (Dating: EDC 8/22, LMP), with the above CC. Pregnancy complicated by: above CC., BMI 31, b/l ovarian cysts  No s/s of pre-eclampsia, labor or decreased FM. Normal AFI on 7/27.   Review of Systems:her 12 point review of systems is negative or as noted in the History of Present Illness.  PMHx:  Past Medical History:  Diagnosis Date  . Medical history non-contributory    PSHx:  Past Surgical History:  Procedure Laterality Date  . NO PAST SURGERIES     Medications: PNV  Allergies: has No Known Allergies. OBHx:  OB History  Gravida Para Term Preterm AB Living  1            SAB TAB Ectopic Multiple Live Births               # Outcome Date GA Lbr Len/2nd Weight Sex Delivery Anes PTL Lv  1 Current              GYNHx: no h/o STIs       FHx: No family history on file. Soc Hx:  Social History   Social History  . Marital status: Single    Spouse name: N/A  . Number of children: N/A  . Years of education: N/A   Occupational History  . Not on file.   Social History Main Topics  . Smoking status: Never Smoker  . Smokeless tobacco: Never Used  . Alcohol use No  . Drug use: No  . Sexual activity: Yes   Other Topics Concern  . Not on file   Social History Narrative  . No narrative on file    Objective  135/84 91 206 lbs  EFM: 125 baseline, +accels, no decel (one maternal artifact), mod var  Toco: occasional UCs  General: Well nourished, well developed female in no acute distress.  Skin:  Warm and dry.  Cardiovascular: S1, S2 normal, no murmur, rub or gallop, regular rate and rhythm Respiratory:  Clear to auscultation bilateral. Normal respiratory effort Abdomen: gravid, soft, nttp Neuro/Psych:  Normal mood and affect.   SSE: deferred SVE:  deferred Leopolds/EFW: cephalic, 3400gm  Labs  None   Radiology 7/20: efw 68%, 2714gm, normal AFI/AC and 8/8 BPP. Multiple b/l ovarian cysts seen (not described)  Perinatal info   ABO, Rh: A/POS/-- (05/10 0233) Antibody: NEG (05/10 0924) Rubella: <0.90 (05/10 0924) RPR: NON REAC (05/10 0924)  HBsAg: NEGATIVE (05/10 0924)  HIV: NONREACTIVE (05/10 0924)  GBS: neg 1 hr Glucola  Neg Tdap: needed PP Pap: neg 2017  Assessment & Plan   30 y.o. G1P0 @ [redacted]w[redacted]d with mild pre-x; pt currently stable *IUP: fetal status reassuring *IOL: d/w pt rationale and she is amenable to IOL. Pt set up for 8/3 and d/w pt that will check cx then and likely will need foley and/or cytotec for cx ripening. Also d/w pt increased risk for c-section and possible multiple day long IOL likely to be needed *GBS: neg *Mild pre-x: precautions and FKC warnings given. *cysts: not described but told pt that it's like theca lutein cysts and will f/u up 6-12wks PP  Cornelia Copa MD Attending Center for [facility])

## 2015-08-11 ENCOUNTER — Telehealth (HOSPITAL_COMMUNITY): Payer: Self-pay | Admitting: *Deleted

## 2015-08-11 NOTE — Telephone Encounter (Signed)
Preadmission screen  

## 2015-08-12 ENCOUNTER — Other Ambulatory Visit: Payer: Self-pay | Admitting: Advanced Practice Midwife

## 2015-08-13 ENCOUNTER — Encounter (HOSPITAL_COMMUNITY): Payer: Self-pay

## 2015-08-13 ENCOUNTER — Inpatient Hospital Stay (HOSPITAL_COMMUNITY)
Admission: RE | Admit: 2015-08-13 | Discharge: 2015-08-17 | DRG: 774 | Disposition: A | Discharge status: Home / Self Care | Payer: Medicaid Other | Source: Ambulatory Visit | Attending: Family Medicine | Admitting: Family Medicine

## 2015-08-13 DIAGNOSIS — O1002 Pre-existing essential hypertension complicating childbirth: Secondary | ICD-10-CM | POA: Diagnosis present

## 2015-08-13 DIAGNOSIS — O1400 Mild to moderate pre-eclampsia, unspecified trimester: Secondary | ICD-10-CM | POA: Diagnosis present

## 2015-08-13 DIAGNOSIS — N83209 Unspecified ovarian cyst, unspecified side: Secondary | ICD-10-CM | POA: Diagnosis present

## 2015-08-13 DIAGNOSIS — O3483 Maternal care for other abnormalities of pelvic organs, third trimester: Secondary | ICD-10-CM | POA: Diagnosis present

## 2015-08-13 DIAGNOSIS — O114 Pre-existing hypertension with pre-eclampsia, complicating childbirth: Principal | ICD-10-CM | POA: Diagnosis present

## 2015-08-13 DIAGNOSIS — Z3A37 37 weeks gestation of pregnancy: Secondary | ICD-10-CM

## 2015-08-13 DIAGNOSIS — O1403 Mild to moderate pre-eclampsia, third trimester: Secondary | ICD-10-CM | POA: Diagnosis not present

## 2015-08-13 DIAGNOSIS — Z3A36 36 weeks gestation of pregnancy: Secondary | ICD-10-CM | POA: Diagnosis not present

## 2015-08-13 DIAGNOSIS — O1404 Mild to moderate pre-eclampsia, complicating childbirth: Secondary | ICD-10-CM | POA: Diagnosis not present

## 2015-08-13 LAB — COMPREHENSIVE METABOLIC PANEL
ALBUMIN: 3.2 g/dL — AB (ref 3.5–5.0)
ALK PHOS: 141 U/L — AB (ref 38–126)
ALT: 18 U/L (ref 14–54)
ANION GAP: 7 (ref 5–15)
AST: 25 U/L (ref 15–41)
BILIRUBIN TOTAL: 0.8 mg/dL (ref 0.3–1.2)
BUN: 7 mg/dL (ref 6–20)
CALCIUM: 9.1 mg/dL (ref 8.9–10.3)
CO2: 22 mmol/L (ref 22–32)
Chloride: 107 mmol/L (ref 101–111)
Creatinine, Ser: 0.55 mg/dL (ref 0.44–1.00)
GFR calc Af Amer: >60 mL/min (ref >60)
GFR calc non Af Amer: >60 mL/min (ref >60)
GLUCOSE: 90 mg/dL (ref 65–99)
Potassium: 4.1 mmol/L (ref 3.5–5.1)
Sodium: 136 mmol/L (ref 135–145)
TOTAL PROTEIN: 6.5 g/dL (ref 6.5–8.1)

## 2015-08-13 LAB — CBC
HEMATOCRIT: 36.3 % (ref 36.0–46.0)
HEMATOCRIT: 35.7 % — AB (ref 36.0–46.0)
HEMOGLOBIN: 12.2 g/dL (ref 12.0–15.0)
Hemoglobin: 12.4 g/dL (ref 12.0–15.0)
MCH: 28.3 pg (ref 26.0–34.0)
MCH: 29.2 pg (ref 26.0–34.0)
MCHC: 33.6 g/dL (ref 30.0–36.0)
MCHC: 34.7 g/dL (ref 30.0–36.0)
MCV: 84.2 fL (ref 78.0–100.0)
MCV: 84 fL (ref 78.0–100.0)
Platelets: 264 10*3/uL (ref 150–400)
Platelets: 248 10*3/uL (ref 150–400)
RBC: 4.31 MIL/uL (ref 3.87–5.11)
RBC: 4.25 MIL/uL (ref 3.87–5.11)
RDW: 15 % (ref 11.5–15.5)
RDW: 14.9 % (ref 11.5–15.5)
WBC: 10.9 10*3/uL — ABNORMAL HIGH (ref 4.0–10.5)
WBC: 8.9 10*3/uL (ref 4.0–10.5)

## 2015-08-13 LAB — TYPE AND SCREEN
ABO/RH(D): A POS
ANTIBODY SCREEN: NEGATIVE

## 2015-08-13 LAB — ABO/RH: ABO/RH(D): A POS

## 2015-08-13 LAB — RPR: RPR Ser Ql: NONREACTIVE

## 2015-08-13 MED ORDER — LIDOCAINE HCL (PF) 1 % IJ SOLN
30.0000 mL | INTRAMUSCULAR | Status: DC | PRN
  Filled 2015-08-13: qty 30

## 2015-08-13 MED ORDER — FENTANYL CITRATE (PF) 100 MCG/2ML IJ SOLN
50.0000 ug | INTRAMUSCULAR | Status: AC | PRN
  Administered 2015-08-13 – 2015-08-14 (×5): 50 ug via INTRAVENOUS
  Filled 2015-08-13 (×5): qty 2

## 2015-08-13 MED ORDER — SOD CITRATE-CITRIC ACID 500-334 MG/5ML PO SOLN: 30.0000 mL | ORAL | Status: DC | PRN

## 2015-08-13 MED ORDER — ONDANSETRON HCL 4 MG/2ML IJ SOLN
4.0000 mg | Freq: Four times a day (QID) | INTRAMUSCULAR | Status: DC | PRN
  Administered 2015-08-14 (×2): 4 mg via INTRAVENOUS
  Filled 2015-08-13 (×3): qty 2

## 2015-08-13 MED ORDER — ZOLPIDEM TARTRATE 5 MG PO TABS
5.0000 mg | ORAL_TABLET | Freq: Every evening | ORAL | Status: DC | PRN
  Administered 2015-08-14: 5 mg via ORAL
  Filled 2015-08-13: qty 1

## 2015-08-13 MED ORDER — OXYTOCIN BOLUS FROM INFUSION: 500.0000 mL | Freq: Once | INTRAVENOUS | Status: DC

## 2015-08-13 MED ORDER — OXYTOCIN 40 UNITS IN LACTATED RINGERS INFUSION - SIMPLE MED: 2.5000 [IU]/h | INTRAVENOUS | Status: DC

## 2015-08-13 MED ORDER — MISOPROSTOL 200 MCG PO TABS: 50.0000 ug | ORAL_TABLET | ORAL | Status: DC

## 2015-08-13 MED ORDER — ACETAMINOPHEN 325 MG PO TABS
650.0000 mg | ORAL_TABLET | ORAL | Status: DC | PRN
  Administered 2015-08-14: 650 mg via ORAL
  Filled 2015-08-13: qty 2

## 2015-08-13 MED ORDER — LACTATED RINGERS IV SOLN
INTRAVENOUS | Status: DC
  Administered 2015-08-13 – 2015-08-14 (×4): via INTRAVENOUS

## 2015-08-13 MED ORDER — TERBUTALINE SULFATE 1 MG/ML IJ SOLN: 0.2500 mg | Freq: Once | INTRAMUSCULAR | Status: DC | PRN

## 2015-08-13 MED ORDER — MISOPROSTOL 25 MCG QUARTER TABLET
25.0000 ug | ORAL_TABLET | ORAL | Status: DC
  Administered 2015-08-13 (×3): 25 ug via VAGINAL
  Filled 2015-08-13 (×2): qty 0.25
  Filled 2015-08-13 (×2): qty 1
  Filled 2015-08-13: qty 0.25

## 2015-08-13 MED ORDER — LACTATED RINGERS IV SOLN: 500.0000 mL | INTRAVENOUS | Status: DC | PRN

## 2015-08-13 NOTE — Progress Notes (Signed)
Patient ID: Jacqueline [name], female   DOB: 12-23-1985, 30 y.o.   MRN: [id number]  LABOR PROGRESS NOTE  Jacqueline [name] is a 30 y.o. G1P0 at [redacted]w[redacted]d  admitted for IOL 2/2 cHTN and mild preeclampsia.   Subjective: Patient seen & examined for progress of labor. Patient comfortable in the chair. Discussed benefits/risks of foley bulb placement to assist with IOL, patient is in agreement for placement.  Objective: BP 130/85   Pulse 90   Temp 98.4 F (36.9 C) (Oral)   Resp 16   Ht 5\' 6"  (1.676 m)   Wt 207 lb (93.9 kg)   LMP 11/25/2014 (Exact Date)   BMI 33.41 kg/m  or  Vitals:   08/13/15 0742 08/13/15 1135 08/13/15 1501  BP: (!) 136/91 137/70 130/85  Pulse: (!) 116 98 90  Resp: 16    Temp: 98.6 F (37 C) 98.7 F (37.1 C) 98.4 F (36.9 C)  TempSrc: Oral Oral Oral  Weight: 207 lb (93.9 kg)    Height: 5\' 6"  (1.676 m)      FHT: Cat I, 140, mod var, +accels, -decels Dilation: 1.5 Effacement (%): 50 Cervical Position: Middle Station: -2 Presentation: Vertex Exam by:: timberlake md Foley bulb placed without complications, filled with 60cc saline, light traction applied, confirmed placement. Cytotec placed in the posterior fornix.   Labs: Lab Results  Component Value Date   WBC 10.9 (H) 08/13/2015   HGB 12.2 08/13/2015   HCT 36.3 08/13/2015   MCV 84.2 08/13/2015   PLT 264 08/13/2015    Patient Active Problem List   Diagnosis Date Noted  . Mild pre-eclampsia 08/06/2015  . Ovarian cyst in pregnancy 07/30/2015  . Rubella non-immune status, antepartum 05/27/2015  . Supervision of high-risk pregnancy 05/20/2015    Assessment / Plan: 30 y.o. G1P0 at [redacted]w[redacted]d here for IOL for cHTN and mild pre-eclampsia.  Labor: IOL with cytotec and foley bulb placement. Fetal Wellbeing:  Cat I Pain Control:  None currently, Epidural on maternal request Anticipated MOD: SVD  Jen Mow, DO OB Fellow Center for [employer], [facility] 08/13/2015, 5:08 PM

## 2015-08-13 NOTE — Progress Notes (Signed)
Patient uncomfortable with cervical exam. Exam required a lot of reassurance and coaching by RN as patient tended to contract thigh muscles and try to close legs. [address] [place], [place] 08/13/2015 [phone]

## 2015-08-13 NOTE — Anesthesia Pain Management Evaluation Note (Signed)
  CRNA Pain Management Visit Note  Patient: Jacqueline [name], 30 y.o., female  "Hello I am a member of the anesthesia team at [facility]. We have an anesthesia team available at all times to provide care throughout the hospital, including epidural management and anesthesia for C-section. I don't know your plan for the delivery whether it a natural birth, water birth, IV sedation, nitrous supplementation, doula or epidural, but we want to meet your pain goals."   1.Was your pain managed to your expectations on prior hospitalizations?    2.What is your expectation for pain management during this hospitalization?       3.How can we help you reach that goal?   Record the patient's initial score and the patient's pain goal.   Pain: 4  Pain Goal: 6 [facility] wants you to be able to say your pain was always managed very well.  Laban Emperor 08/13/2015

## 2015-08-13 NOTE — H&P (Signed)
Primary OBGYN: [facility]  Chief Complaint: IOL for pre-eclampsia w/o severe features  History of Present Illness  30 y.o. G1 @ [redacted]w[redacted]d (Dating: EDC 8/22, LMP), with the above CC. Pregnancy complicated by: above CC., BMI 31, b/l ovarian cysts  No s/s of pre-eclampsia, labor or decreased FM. Normal AFI on 7/27.   PMHx:      Past Medical History:  Diagnosis Date  . Medical history non-contributory    PSHx:       Past Surgical History:  Procedure Laterality Date  . NO PAST SURGERIES     Medications: PNV  Allergies: has No Known Allergies. OBHx:                  OB History  Gravida Para Term Preterm AB Living  1            SAB TAB Ectopic Multiple Live Births               # Outcome Date GA Lbr Len/2nd Weight Sex Delivery Anes PTL Lv  1 Current              GYNHx: no h/o STIs       FHx: No family history on file. Soc Hx:  Social History        Social History  . Marital status: Single    Spouse name: N/A  . Number of children: N/A  . Years of education: N/A      Occupational History  . Not on file.       Social History Main Topics  . Smoking status: Never Smoker  . Smokeless tobacco: Never Used  . Alcohol use No  . Drug use: No  . Sexual activity: Yes       Other Topics Concern  . Not on file      Social History Narrative  . No narrative on file    Objective  135/84 91 206 lbs  EFM: 125 baseline, +accels, no decel (one maternal artifact), mod var  Toco: occasional UCs  General: Well nourished, well developed female in no acute distress.  Skin:  Warm and dry.  Cardiovascular: S1, S2 normal, no murmur, rub or gallop, regular rate and rhythm Respiratory:  Clear to auscultation bilateral. Normal respiratory effort Abdomen: gravid, soft, nttp Neuro/Psych:  Normal mood and affect.    SVE: Fingertip Leopolds/EFW: cephalic, 3400gm  Radiology 7/20: efw 68%, 2714gm, normal AFI/AC and 8/8 BPP.  Multiple b/l ovarian cysts seen (not described)  Perinatal info   ABO, Rh: A/POS/-- (05/10 6294) Antibody: NEG (05/10 0924) Rubella: <0.90 (05/10 0924) RPR: NON REAC (05/10 0924)  HBsAg: NEGATIVE (05/10 0924)  HIV: NONREACTIVE (05/10 0924)  GBS: neg 1 hr Glucola  Neg Tdap: needed PP Pap: neg 2017  Assessment & Plan   30 y.o. G1P0 @ [redacted]w[redacted]d with mild pre-x; pt currently stable *IUP: fetal status reassuring *IOL: cytotec *GBS: neg *Mild pre-x: will watch BPs, no meds needed at this time, repeating CBC, CMP   Garth Bigness, MD   OB FELLOW HISTORY AND PHYSICAL ATTESTATION  I have seen and examined this patient; I agree with above documentation in the resident's note.    Jen Mow, DO OB Fellow 08/14/2015, 12:01 AM

## 2015-08-14 ENCOUNTER — Inpatient Hospital Stay (HOSPITAL_COMMUNITY): Payer: Medicaid Other | Admitting: Anesthesiology

## 2015-08-14 LAB — CBC
HCT: 34.5 % — ABNORMAL LOW (ref 36.0–46.0)
HEMOGLOBIN: 11.6 g/dL — AB (ref 12.0–15.0)
MCH: 28.3 pg (ref 26.0–34.0)
MCHC: 33.6 g/dL (ref 30.0–36.0)
MCV: 84.1 fL (ref 78.0–100.0)
PLATELETS: 238 10*3/uL (ref 150–400)
RBC: 4.1 MIL/uL (ref 3.87–5.11)
RDW: 14.9 % (ref 11.5–15.5)
WBC: 14.2 10*3/uL — AB (ref 4.0–10.5)

## 2015-08-14 MED ORDER — FENTANYL 2.5 MCG/ML BUPIVACAINE 1/10 % EPIDURAL INFUSION (WH - ANES): 14.0000 mL/h | INTRAMUSCULAR | Status: DC | PRN

## 2015-08-14 MED ORDER — DIPHENHYDRAMINE HCL 50 MG/ML IJ SOLN: 12.5000 mg | INTRAMUSCULAR | Status: DC | PRN

## 2015-08-14 MED ORDER — TERBUTALINE SULFATE 1 MG/ML IJ SOLN
0.2500 mg | Freq: Once | INTRAMUSCULAR | Status: DC | PRN
  Filled 2015-08-14: qty 1

## 2015-08-14 MED ORDER — LACTATED RINGERS IV SOLN
500.0000 mL | Freq: Once | INTRAVENOUS | Status: AC
  Administered 2015-08-14: 500 mL via INTRAVENOUS

## 2015-08-14 MED ORDER — EPHEDRINE 5 MG/ML INJ: 10.0000 mg | INTRAVENOUS | Status: DC | PRN

## 2015-08-14 MED ORDER — CALCIUM CARBONATE ANTACID 500 MG PO CHEW
1.0000 | CHEWABLE_TABLET | Freq: Two times a day (BID) | ORAL | Status: DC
  Administered 2015-08-14: 200 mg via ORAL
  Filled 2015-08-14: qty 1

## 2015-08-14 MED ORDER — LIDOCAINE HCL (PF) 1 % IJ SOLN
INTRAMUSCULAR | Status: DC | PRN
  Administered 2015-08-14 (×2): 5 mL via EPIDURAL

## 2015-08-14 MED ORDER — FENTANYL 2.5 MCG/ML BUPIVACAINE 1/10 % EPIDURAL INFUSION (WH - ANES)
14.0000 mL/h | INTRAMUSCULAR | Status: DC | PRN
  Administered 2015-08-14 (×4): 14 mL/h via EPIDURAL
  Filled 2015-08-14 (×2): qty 125

## 2015-08-14 MED ORDER — PHENYLEPHRINE 40 MCG/ML (10ML) SYRINGE FOR IV PUSH (FOR BLOOD PRESSURE SUPPORT): 80.0000 ug | PREFILLED_SYRINGE | INTRAVENOUS | Status: DC | PRN

## 2015-08-14 MED ORDER — SODIUM CHLORIDE 0.9 % IV SOLN
2.0000 g | Freq: Four times a day (QID) | INTRAVENOUS | Status: DC
  Administered 2015-08-14: 2 g via INTRAVENOUS
  Filled 2015-08-14 (×3): qty 2000

## 2015-08-14 MED ORDER — EPHEDRINE 5 MG/ML INJ
10.0000 mg | INTRAVENOUS | Status: DC | PRN
  Filled 2015-08-14: qty 4

## 2015-08-14 MED ORDER — LACTATED RINGERS IV SOLN: 500.0000 mL | Freq: Once | INTRAVENOUS | Status: DC

## 2015-08-14 MED ORDER — FENTANYL 2.5 MCG/ML BUPIVACAINE 1/10 % EPIDURAL INFUSION (WH - ANES)
INTRAMUSCULAR | Status: AC
  Filled 2015-08-14: qty 125

## 2015-08-14 MED ORDER — PHENYLEPHRINE 40 MCG/ML (10ML) SYRINGE FOR IV PUSH (FOR BLOOD PRESSURE SUPPORT)
80.0000 ug | PREFILLED_SYRINGE | INTRAVENOUS | Status: DC | PRN
  Filled 2015-08-14: qty 5

## 2015-08-14 MED ORDER — OXYTOCIN 40 UNITS IN LACTATED RINGERS INFUSION - SIMPLE MED
1.0000 m[IU]/min | INTRAVENOUS | Status: DC
  Administered 2015-08-14: 14 m[IU]/min via INTRAVENOUS
  Administered 2015-08-14: 2 m[IU]/min via INTRAVENOUS
  Filled 2015-08-14: qty 1000

## 2015-08-14 MED ORDER — PHENYLEPHRINE 40 MCG/ML (10ML) SYRINGE FOR IV PUSH (FOR BLOOD PRESSURE SUPPORT)
80.0000 ug | PREFILLED_SYRINGE | INTRAVENOUS | Status: DC | PRN
  Filled 2015-08-14: qty 10
  Filled 2015-08-14: qty 5

## 2015-08-14 MED ORDER — GENTAMICIN SULFATE 40 MG/ML IJ SOLN
1.5000 mg/kg | Freq: Three times a day (TID) | INTRAMUSCULAR | Status: DC
  Administered 2015-08-14: 140 mg via INTRAVENOUS
  Filled 2015-08-14 (×3): qty 3.5

## 2015-08-14 NOTE — Progress Notes (Signed)
Patient ID: Tyre Yanta, female   DOB: 08-22-1985, 30 y.o.   MRN: [id number]  LABOR PROGRESS NOTE  Christean Rezek is a 30 y.o. G1P0 at [redacted]w[redacted]d  admitted for IOL for chronic HTN with superimposed preeclampsia.   Subjective: Patient seen & examined for progress of labor. Patient currently comfortable with the epidural. On pitocin, SROM at 1500.   Objective: BP (!) 149/76   Pulse (!) 120   Temp 97.7 F (36.5 C) (Oral)   Resp 18   Ht 5\' 6"  (1.676 m)   Wt 207 lb (93.9 kg)   LMP 11/25/2014 (Exact Date)   SpO2 100%   BMI 33.41 kg/m  or  Vitals:   08/14/15 1700 08/14/15 1730 08/14/15 1829 08/14/15 1830  BP: 126/82 140/80  (!) 149/76  Pulse: (!) 103 (!) 113  (!) 120  Resp: 18 18    Temp: 99.7 F (37.6 C)  99.7 F (37.6 C) 97.7 F (36.5 C)  TempSrc: Oral  Oral Oral  SpO2:      Weight:      Height:        FHT: 145, mod var, +accels, occasional variables. Dilation: 8 Effacement (%): 90 Cervical Position: Anterior Station: -1 Presentation: Vertex Exam by:: Dr. Omer Jack  Labs: Lab Results  Component Value Date   WBC 14.2 (H) 08/14/2015   HGB 11.6 (L) 08/14/2015   HCT 34.5 (L) 08/14/2015   MCV 84.1 08/14/2015   PLT 238 08/14/2015    Patient Active Problem List   Diagnosis Date Noted  . Mild pre-eclampsia 08/06/2015  . Ovarian cyst in pregnancy 07/30/2015  . Rubella non-immune status, antepartum 05/27/2015  . Supervision of high-risk pregnancy 05/20/2015    Assessment / Plan: 30 y.o. G1P0 at [redacted]w[redacted]d here for IOL for cHTN with superimposed preeclampsia.  Labor: Pitocin, induction Fetal Wellbeing:  Cat II Pain Control:  EPidural Anticipated MOD:  SVD  Jen Mow, DO OB Fellow Center for [employer], [facility] 08/14/2015, 6:55 PM

## 2015-08-14 NOTE — Anesthesia Procedure Notes (Addendum)
Epidural Patient location during procedure: OB Start time: 08/14/2015 8:10 AM End time: 08/14/2015 8:25 AM  Staffing Anesthesiologist: Ronelle Nigh Performed: anesthesiologist   Preanesthetic Checklist Completed: patient identified, site marked, surgical consent, pre-op evaluation, timeout performed, IV checked, risks and benefits discussed and monitors and equipment checked  Epidural Patient position: sitting Prep: site prepped and draped and DuraPrep Patient monitoring: continuous pulse ox and blood pressure Approach: midline Location: L3-L4 Injection technique: LOR air  Needle:  Needle type: Tuohy  Needle gauge: 17 G Needle length: 9 cm and 9 Needle insertion depth: 8 cm Catheter type: closed end flexible Catheter size: 19 Gauge Catheter at skin depth: 15 cm Test dose: negative  Assessment Sensory level: T10 Events: blood not aspirated, injection not painful, no injection resistance, negative IV test and no paresthesia  Additional Notes Patient identified. Risks/Benefits/Options discussed with patient including but not limited to bleeding, infection, nerve damage, paralysis, failed block, incomplete pain control, headache, blood pressure changes, nausea, vomiting, reactions to medication both or allergic, itching and postpartum back pain. Confirmed with bedside nurse the patient's most recent platelet count. Confirmed with patient that they are not currently taking any anticoagulation, have any bleeding history or any family history of bleeding disorders. Patient expressed understanding and wished to proceed. All questions were answered. Sterile technique was used throughout the entire procedure. Please see nursing notes for vital signs. Test dose was given through epidural catheter and negative prior to continuing to dose epidural or start infusion. Warning signs of high block given to the patient including shortness of breath, tingling/numbness in hands, complete motor block, or  any concerning symptoms with instructions to call for help. Patient was given instructions on fall risk and not to get out of bed. All questions and concerns addressed with instructions to call with any issues or inadequate analgesia.

## 2015-08-14 NOTE — Progress Notes (Signed)
   Jacqueline [name] is a 30 y.o. G1P0 at [redacted]w[redacted]d  admitted for induction of labor due to preeclampsia w/mild features.  Subjective: Ctx are not strong  Objective: Vitals:   08/14/15 0530 08/14/15 0617 08/14/15 0630 08/14/15 0700  BP: (!) 150/90 133/78 126/73 (!) 147/83  Pulse: (!) 118 (!) 118 (!) 113 (!) 122  Resp: 16 16 18 16   Temp:    99.7 F (37.6 C)  TempSrc:    Axillary  Weight:      Height:       No intake/output data recorded.  FHT:  FHR: 150 bpm, variability: moderate,  accelerations:  Present,  decelerations:  Absent UC:   irregular, every 2-6 minutes SVE:   Dilation: 4 Effacement (%): 50 Station: -2 Exam by:: Jacqueline [name] Pitocin @ 2 mu/min   Foley fell out around 0500.   Labs: Lab Results  Component Value Date   WBC 8.9 08/13/2015   HGB 12.4 08/13/2015   HCT 35.7 (L) 08/13/2015   MCV 84.0 08/13/2015   PLT 248 08/13/2015    Assessment / Plan: IOL for mild preeclampsia, ripening phase complete.    Labor: Progressing normally Fetal Wellbeing:  Category I Pain Control:  IV pain meds Anticipated MOD:  NSVD  Jacqueline [name],Jacqueline [name] 08/14/2015, 7:15 AM

## 2015-08-14 NOTE — Anesthesia Preprocedure Evaluation (Signed)
Anesthesia Evaluation  Patient identified by MRN, date of birth, ID band Patient awake    Reviewed: Allergy & Precautions, H&P , NPO status , Patient's Chart, lab work & pertinent test results  Airway Mallampati: II  TM Distance: >3 FB Neck ROM: full    Dental no notable dental hx. (+) Teeth Intact, Dental Advisory Given   Pulmonary neg pulmonary ROS,    Pulmonary exam normal breath sounds clear to auscultation       Cardiovascular Exercise Tolerance: Good negative cardio ROS Normal cardiovascular exam Rhythm:regular Rate:Normal     Neuro/Psych negative neurological ROS  negative psych ROS   GI/Hepatic negative GI ROS, Neg liver ROS,   Endo/Other  negative endocrine ROS  Renal/GU negative Renal ROS  negative genitourinary   Musculoskeletal   Abdominal   Peds  Hematology negative hematology ROS (+)   Anesthesia Other Findings   Reproductive/Obstetrics (+) Pregnancy                             Anesthesia Physical Anesthesia Plan  ASA: II  Anesthesia Plan: Epidural   Post-op Pain Management:    Induction:   Airway Management Planned:   Additional Equipment:   Intra-op Plan:   Post-operative Plan:   Informed Consent: I have reviewed the patients History and Physical, chart, labs and discussed the procedure including the risks, benefits and alternatives for the proposed anesthesia with the patient or authorized representative who has indicated his/her understanding and acceptance.   Dental Advisory Given  Plan Discussed with: CRNA  Anesthesia Plan Comments:         Anesthesia Quick Evaluation

## 2015-08-15 ENCOUNTER — Encounter (HOSPITAL_COMMUNITY): Payer: Self-pay | Admitting: Anesthesiology

## 2015-08-15 ENCOUNTER — Encounter (HOSPITAL_COMMUNITY): Payer: Self-pay

## 2015-08-15 DIAGNOSIS — O1490 Unspecified pre-eclampsia, unspecified trimester: Secondary | ICD-10-CM

## 2015-08-15 DIAGNOSIS — Z3A36 36 weeks gestation of pregnancy: Secondary | ICD-10-CM

## 2015-08-15 DIAGNOSIS — O1404 Mild to moderate pre-eclampsia, complicating childbirth: Secondary | ICD-10-CM

## 2015-08-15 LAB — CBC
HEMATOCRIT: 32.7 % — AB (ref 36.0–46.0)
HEMOGLOBIN: 11.2 g/dL — AB (ref 12.0–15.0)
MCH: 28.7 pg (ref 26.0–34.0)
MCHC: 34.3 g/dL (ref 30.0–36.0)
MCV: 83.8 fL (ref 78.0–100.0)
PLATELETS: 231 10*3/uL (ref 150–400)
RBC: 3.9 MIL/uL (ref 3.87–5.11)
RDW: 15.2 % (ref 11.5–15.5)
WBC: 24.4 10*3/uL — AB (ref 4.0–10.5)

## 2015-08-15 MED ORDER — ONDANSETRON HCL 4 MG/2ML IJ SOLN: 4.0000 mg | INTRAMUSCULAR | Status: DC | PRN

## 2015-08-15 MED ORDER — SIMETHICONE 80 MG PO CHEW: 80.0000 mg | CHEWABLE_TABLET | ORAL | Status: DC | PRN

## 2015-08-15 MED ORDER — COCONUT OIL OIL
1.0000 "application " | TOPICAL_OIL | Status: DC | PRN
  Administered 2015-08-16: 1 via TOPICAL
  Filled 2015-08-15: qty 120

## 2015-08-15 MED ORDER — BENZOCAINE-MENTHOL 20-0.5 % EX AERO
1.0000 "application " | INHALATION_SPRAY | CUTANEOUS | Status: DC | PRN
  Administered 2015-08-17: 1 via TOPICAL
  Filled 2015-08-15: qty 56

## 2015-08-15 MED ORDER — ACETAMINOPHEN 325 MG PO TABS: 650.0000 mg | ORAL_TABLET | ORAL | Status: DC | PRN

## 2015-08-15 MED ORDER — IBUPROFEN 600 MG PO TABS
600.0000 mg | ORAL_TABLET | Freq: Four times a day (QID) | ORAL | Status: DC
  Administered 2015-08-15 – 2015-08-17 (×10): 600 mg via ORAL
  Filled 2015-08-15 (×10): qty 1

## 2015-08-15 MED ORDER — PRENATAL MULTIVITAMIN CH
1.0000 | ORAL_TABLET | Freq: Every day | ORAL | Status: DC
  Administered 2015-08-15 – 2015-08-16 (×2): 1 via ORAL
  Filled 2015-08-15 (×3): qty 1

## 2015-08-15 MED ORDER — DIPHENHYDRAMINE HCL 25 MG PO CAPS: 25.0000 mg | ORAL_CAPSULE | Freq: Four times a day (QID) | ORAL | Status: DC | PRN

## 2015-08-15 MED ORDER — TETANUS-DIPHTH-ACELL PERTUSSIS 5-2.5-18.5 LF-MCG/0.5 IM SUSP: 0.5000 mL | Freq: Once | INTRAMUSCULAR | Status: DC

## 2015-08-15 MED ORDER — ONDANSETRON HCL 4 MG PO TABS: 4.0000 mg | ORAL_TABLET | ORAL | Status: DC | PRN

## 2015-08-15 MED ORDER — WITCH HAZEL-GLYCERIN EX PADS: 1.0000 "application " | MEDICATED_PAD | CUTANEOUS | Status: DC | PRN

## 2015-08-15 MED ORDER — ZOLPIDEM TARTRATE 5 MG PO TABS: 5.0000 mg | ORAL_TABLET | Freq: Every evening | ORAL | Status: DC | PRN

## 2015-08-15 MED ORDER — SENNOSIDES-DOCUSATE SODIUM 8.6-50 MG PO TABS
2.0000 | ORAL_TABLET | ORAL | Status: DC
  Administered 2015-08-15 – 2015-08-16 (×2): 2 via ORAL
  Filled 2015-08-15 (×2): qty 2

## 2015-08-15 MED ORDER — DIBUCAINE 1 % RE OINT: 1.0000 "application " | TOPICAL_OINTMENT | RECTAL | Status: DC | PRN

## 2015-08-15 NOTE — Anesthesia Postprocedure Evaluation (Signed)
Anesthesia Post Note  Patient: Jacqueline [name]  Procedure(s) Performed: * No procedures listed *  Patient location during evaluation: Mother Baby Anesthesia Type: Epidural Level of consciousness: awake and alert Pain management: satisfactory to patient Vital Signs Assessment: post-procedure vital signs reviewed and stable Respiratory status: respiratory function stable Cardiovascular status: stable Postop Assessment: no headache, no backache, epidural receding, patient able to bend at knees, no signs of nausea or vomiting and adequate PO intake Anesthetic complications: no     Last Vitals:  Vitals:   08/15/15 0505 08/15/15 0900  BP: 130/74 133/74  Pulse: (!) 134 99  Resp: 18 18  Temp: 37.5 C 36.8 C    Last Pain:  Vitals:   08/15/15 1155  TempSrc:   PainSc: 0-No pain   Pain Goal:                 Jacqueline [name]

## 2015-08-15 NOTE — Progress Notes (Signed)
Pt declines Tdap and MMR. VIS sheets given for both. Wille Celeste

## 2015-08-15 NOTE — Anesthesia Postprocedure Evaluation (Signed)
Anesthesia Post Note  Patient: Jacqueline [name]  Procedure(s) Performed: * No procedures listed *  Patient location during evaluation: Mother Baby Anesthesia Type: Epidural Level of consciousness: awake and alert Pain management: pain level controlled Vital Signs Assessment: post-procedure vital signs reviewed and stable Respiratory status: spontaneous breathing, nonlabored ventilation and respiratory function stable Cardiovascular status: stable Postop Assessment: no headache, no backache, epidural receding and patient able to bend at knees Anesthetic complications: no     Last Vitals:  Vitals:   08/15/15 0505 08/15/15 0900  BP: 130/74 133/74  Pulse: (!) 134 99  Resp: 18 18  Temp: 37.5 C 36.8 C    Last Pain:  Vitals:   08/15/15 1155  TempSrc:   PainSc: 0-No pain   Pain Goal:                 Rica Records

## 2015-08-15 NOTE — Lactation Note (Signed)
This note was copied from a baby's chart. Lactation Consultation Note Initial visit at 16 hours of age.  Mom reports a good feeding soon after delivery and baby has been sleepy since.  Mom reports trying STS and baby has still been sleepy. LC assisted with hand expression of a few drops applied to baby's mouth with gloved finger.  Baby began to waken and was placed STS in cross cradle hold on left breast.  Baby latches and sucks a few times and pushes nipple out of his mouth repeatedly.  LC then assisted with "laid back" position and baby tolerated better.  MOm may continue to need assist with positioning for latch.  Right areola more tough and less compressible, possibly edema.  LC encouraged mom to wear a bra and revere pressure was taught.  [facility] LC resources given and discussed.  Encouraged to feed with early cues on demand.  Early newborn behavior discussed. Mom to call for assist as needed.    Patient Name: Jacqueline [name] [id number] Date: 08/15/2015 Reason for consult: Initial assessment   Maternal Data Has patient been taught Hand Expression?: Yes Does the patient have breastfeeding experience prior to this delivery?: No  Feeding Feeding Type: Breast Fed Length of feed:  (few minutes on and off sucking)  LATCH Score/Interventions Latch: Repeated attempts needed to sustain latch, nipple held in mouth throughout feeding, stimulation needed to elicit sucking reflex. Intervention(s): Adjust position;Assist with latch;Breast massage;Breast compression  Audible Swallowing: None Intervention(s): Skin to skin;Hand expression  Type of Nipple: Everted at rest and after stimulation (right nipple semi compressible)  Comfort (Breast/Nipple): Soft / non-tender     Hold (Positioning): Assistance needed to correctly position infant at breast and maintain latch. Intervention(s): Breastfeeding basics reviewed;Support Pillows;Position options;Skin to skin  LATCH Score: 6  Lactation Tools  Discussed/Used WIC Program: Yes   Consult Status Consult Status: Follow-up Date: 08/16/15 Follow-up type: In-patient    Jacqueline [name] 08/15/2015, 6:51 PM

## 2015-08-16 NOTE — Lactation Note (Signed)
This note was copied from a baby's chart. Lactation Consultation Note Follow up visit at 37 hours of age.  Mom reports better feedings, but having some nipple soreness.  RN gave mom coconut oil and recorded LATCH score of "7".  Baby has had adequate output.  Baby is swaddle next to mom.  LC encouraged mom to wake baby and place STS for feeding.  Mom declines at this time and reports its hard to get baby to sleep.  LC asked mom to call LC or RN at next feeding to assist with latch.  Reviewed early feeding cues and encouraged 8-12 feedings each day.  MOm to call for assist as needed.    Patient Name: Jacqueline [name] [id number]'s Date: 08/16/2015 Reason for consult: Follow-up assessment;Breast/nipple pain   Maternal Data    Feeding Feeding Type: Breast Fed Length of feed: 10 min  LATCH Score/Interventions                Intervention(s): Breastfeeding basics reviewed     Lactation Tools Discussed/Used     Consult Status Consult Status: Follow-up Date: 08/17/15 Follow-up type: In-patient    Shoptaw, Jacqueline [name] 08/16/2015, 3:47 PM

## 2015-08-16 NOTE — Progress Notes (Signed)
Post Partum Day 1 Subjective:  Shireen Quanracy Wisner is a 30 y.o. G1P1001 5159w4d s/p SVD after IOL for mild Pre-Eclampsia.  No acute events overnight.  Pt denies problems with ambulating, voiding or po intake.  She denies nausea or vomiting.  Pain is well controlled.  She has had flatus. She has not had bowel movement.  Lochia Small.  Plan for birth control is undecided.  Method of Feeding: Breast  Objective: Blood pressure 122/65, pulse (!) 102, temperature 97.5 F (36.4 C), resp. rate 18, height 5\' 6"  (1.676 m), weight 93.9 kg (207 lb), last menstrual period 11/25/2014, SpO2 99 %, unknown if currently breastfeeding.  Physical Exam:  General: alert, cooperative and no distress Lochia:normal flow Chest: CTAB Heart: RRR no m/r/g Abdomen: +BS, soft, nontender,  Uterine Fundus: firm, below umbilicus DVT Evaluation: No evidence of DVT seen on physical exam. Extremities: Trace edema   Recent Labs  08/14/15 0655 08/15/15 0420  HGB 11.6* 11.2*  HCT 34.5* 32.7*    Assessment/Plan:  ASSESSMENT: Shireen Quanracy Lecy is a 30 y.o. G1P1001 5459w4d s/p SVD after IOL for mild Pre-Eclampsia. Blood pressures have been stable since delivery. Patient also given antibiotics during labor for Triple I. She has been afebrile since delivery.   Plan for discharge tomorrow and Breastfeeding     LOS: 3 days   De HollingsheadCatherine L Wallace 08/16/2015, 7:24 AM

## 2015-08-17 MED ORDER — ACETAMINOPHEN 325 MG PO TABS: 650.0000 mg | ORAL_TABLET | ORAL | 1 refills | Status: DC | PRN

## 2015-08-17 MED ORDER — IBUPROFEN 600 MG PO TABS
600.0000 mg | ORAL_TABLET | Freq: Four times a day (QID) | ORAL | 0 refills | Status: DC
Start: 2015-08-17 — End: 2016-11-08

## 2015-08-17 MED ORDER — PRENATAL MULTIVITAMIN CH: 1.0000 | ORAL_TABLET | Freq: Every day | ORAL | 1 refills | Status: DC

## 2015-08-17 NOTE — Discharge Summary (Signed)
OB Discharge Summary     Patient Name: Jacqueline [name] DOB: 10-08-1985 MRN: [id number]  Date of admission: 08/13/2015 Delivering MD: Ernestina Penna MICHAEL   Date of discharge: 08/17/2015  Admitting diagnosis: INDUCTION Intrauterine pregnancy: [redacted]w[redacted]d     Secondary diagnosis:  Active Problems:   Mild pre-eclampsia   Normal delivery  Additional problems: NA     Discharge diagnosis: Term Pregnancy Delivered                                                                                                Post partum procedures:NA  Augmentation: Pitocin and Cytotec  Complications: 2nd degree perineal laceration  Hospital course:  Induction of Labor With Vaginal Delivery   30 y.o. yo G1P1001 at [redacted]w[redacted]d was admitted to the hospital 08/13/2015 for induction of labor.  Indication for induction: Preeclampsia.  Patient had an uncomplicated labor course as follows: Membrane Rupture Time/Date: 3:03 PM ,08/14/2015   Intrapartum Procedures: Episiotomy: None [1]                                         Lacerations:  2nd degree [3];Perineal [11]  Patient had delivery of a Viable infant.  Information for the patient's newborn:  Lorieann, Argueta [[id number]]  Delivery Method: Vaginal, Spontaneous Delivery (Filed from Delivery Summary)   08/15/2015  Details of delivery can be found in separate delivery note.  Patient had a routine postpartum course. Patient is discharged home 08/17/15.   Physical exam Vitals:   08/15/15 1800 08/16/15 0634 08/16/15 1730 08/17/15 0526  BP: 118/62 122/65 137/89 137/89  Pulse: (!) 107 (!) 102 93 100  Resp: Temp: 98 F (36.7 C) 97.5 F (36.4 C) 98.3 F (36.8 C) 98.8 F (37.1 C)  TempSrc:   Oral Oral  SpO2: 99%     Weight:      Height:       General: alert, cooperative and no distress Lochia: appropriate Uterine Fundus: firm Incision: N/A DVT Evaluation: No evidence of DVT seen on physical exam. Labs: Lab Results  Component Value Date   WBC  24.4 (H) 08/15/2015   HGB 11.2 (L) 08/15/2015   HCT 32.7 (L) 08/15/2015   MCV 83.8 08/15/2015   PLT 231 08/15/2015   CMP Latest Ref Rng & Units 08/13/2015  Glucose 65 - 99 mg/dL 90  BUN 6 - 20 mg/dL 7  Creatinine 7.82 - 9.56 mg/dL 2.13  Sodium 086 - 578 mmol/L 136  Potassium 3.5 - 5.1 mmol/L 4.1  Chloride 101 - 111 mmol/L 107  CO2 22 - 32 mmol/L 22  Calcium 8.9 - 10.3 mg/dL 9.1  Total Protein 6.5 - 8.1 g/dL 6.5  Total Bilirubin 0.3 - 1.2 mg/dL 0.8  Alkaline Phos 38 - 126 U/L 141(H)  AST 15 - 41 U/L 25  ALT 14 - 54 U/L 18    Discharge instruction: per After Visit Summary and "Baby and Me Booklet".  After visit meds:    Medication List  TAKE these medications   acetaminophen 325 MG tablet Commonly known as:  TYLENOL Take 2 tablets (650 mg total) by mouth every 4 (four) hours as needed (for pain scale < 4).   ibuprofen 600 MG tablet Commonly known as:  ADVIL,MOTRIN Take 1 tablet (600 mg total) by mouth every 6 (six) hours.   prenatal multivitamin Tabs tablet Take 1 tablet by mouth daily at 12 noon.       Diet: routine diet  Activity: Advance as tolerated. Pelvic rest for 6 weeks.   Outpatient follow up:4-6 weeks Follow up Appt:No future appointments. Follow up Visit:No Follow-up on file.  Postpartum contraception: Undecided would like to discuss at her follow up visit  Newborn Data: Live born female  Birth Weight: 6 lb 3.8 oz (2829 g) APGAR: 8, 9  Baby Feeding: Breast Disposition:home with mother   08/17/2015, MD, PGY-1, MPH Andres Egericia Brein, MD

## 2015-08-17 NOTE — Lactation Note (Signed)
This note was copied from a baby's chart. Lactation Consultation Note  Patient Name: Jacqueline [name] Reason for consult: Follow-up assessment Baby at 59 hr of life. Mom is worried she in not making enough milk. She is latching baby, using DEBP, and supplementing. Baby was fussing while lactation was present, suggested mom bf baby and she stated he ate 30 minutes ago. Discussed bf on demand. She is reporting bilateral sore nipples that are getting better. No skin break down noted, she is using coconut oil. Mom sat in the chair to latch baby in cross cradle while baby was still on the bili lights. She was letting baby just suck in the nipple. Showed mom how to pull baby closer to her with the lights still in place, compress the breast, and watch for swallows. Mom stated she will leave him on the breast for 5 minutes then he has to go back in the basinet. Reinforced bf on demand. Reviewed DEBP frequency and normal volume based on age. She is aware of lactation services and support group. She will call as needed.    Maternal Data    Feeding Feeding Type: Breast Fed  LATCH Score/Interventions Latch: Grasps breast easily, tongue down, lips flanged, rhythmical sucking. Intervention(s): Assist with latch;Adjust position  Audible Swallowing: Spontaneous and intermittent Intervention(s): Skin to skin Intervention(s): Alternate breast massage  Type of Nipple: Everted at rest and after stimulation  Comfort (Breast/Nipple): Filling, red/small blisters or bruises, mild/mod discomfort  Problem noted: Mild/Moderate discomfort Interventions (Mild/moderate discomfort): Hand expression  Hold (Positioning): Assistance needed to correctly position infant at breast and maintain latch. Intervention(s): Support Pillows;Position options  LATCH Score: 8  Lactation Tools Discussed/Used     Consult Status Consult Status: Follow-up Date: 08/18/15 Follow-up type:  In-patient    Jacqueline [name] [name], 2:00 PM

## 2015-08-18 ENCOUNTER — Ambulatory Visit: Payer: Self-pay

## 2015-08-18 NOTE — Lactation Note (Signed)
This note was copied from a baby's chart. Lactation Consultation Note  Mother recently breastfed baby for 10 min on each side and baby was cueing in crib. Suggest mother breastfeed longer on each side until baby is satisfied. Mother states nipple soreness has improved.  Provided her w/ hand pump. She re-latched baby in cross cradle hold.  Sucks and swallows observed. Reviewed engorgement care and monitoring voids/stools. Mom encouraged to feed baby 8-12 times/24 hours and with feeding cues.    Patient Name: Jacqueline [name]'JToday's Date: 08/18/2015 Reason for consult: Follow-up assessment   Maternal Data    Feeding Feeding Type: Breast Fed Length of feed: 20 min  LATCH Score/Interventions Latch: Grasps breast easily, tongue down, lips flanged, rhythmical sucking. Intervention(s): Breast massage  Audible Swallowing: A few with stimulation Intervention(s): Hand expression Intervention(s): Alternate breast massage  Type of Nipple: Everted at rest and after stimulation  Comfort (Breast/Nipple): Soft / non-tender  Problem noted: Mild/Moderate discomfort Interventions (Mild/moderate discomfort):  (coconut oil)  Hold (Positioning): Assistance needed to correctly position infant at breast and maintain latch.  LATCH Score: 8  Lactation Tools Discussed/Used     Consult Status Consult Status: Complete    Hardie PulleyBerkelhammer, Michell Kader Boschen 08/18/2015, 11:20 AM

## 2015-08-23 NOTE — Discharge Summary (Signed)
Discharge Summaries Cosign Needed  Date of Service: 08/17/2015 7:10 AM Montez Morita, CNM  Obstetrics  Expand All Collapse All   Hide copied text Hover for attribution information                                              OB Discharge Summary                                               Patient Name: Jacqueline [name] DOB: April 29, 1985 MRN: [id number]  Date of admission: 08/13/2015 Delivering MD: Ernestina Penna MICHAEL   Date of discharge: 08/17/2015  Admitting diagnosis: INDUCTION Intrauterine pregnancy: [redacted]w[redacted]d     Secondary diagnosis:  Active Problems:   Mild pre-eclampsia   Normal delivery  Additional problems: NA                                                                    Discharge diagnosis: Term Pregnancy Delivered                                                                                                Post partum procedures:NA  Augmentation: Pitocin and Cytotec  Complications: 2nd degree perineal laceration  Hospital course:  Induction of Labor With Vaginal Delivery   30 y.o. yo G1P1001 at [redacted]w[redacted]d was admitted to the hospital 08/13/2015 for induction of labor.  Indication for induction: Preeclampsia.  Patient had an uncomplicated labor course as follows: Membrane Rupture Time/Date: 3:03 PM ,08/14/2015   Intrapartum Procedures: Episiotomy: None [1]                                         Lacerations:  2nd degree [3];Perineal [11]  Patient had delivery of a Viable infant.  Information for the patient's newborn:  Danelly, Hassinger [[id number]]  Delivery Method: Vaginal, Spontaneous Delivery (Filed from Delivery Summary)   08/15/2015  Details of delivery can be found in separate delivery note.  Patient had a routine postpartum course. Patient is discharged home 08/17/15.         Physical exam Vitals:   08/15/15 1800 08/16/15 0634 08/16/15 1730 08/17/15 0526  BP: 118/62 122/65 137/89 137/89  Pulse: (!) 107 (!) 102 93 100  Resp: Temp: 98 F (36.7 C) 97.5 F (36.4 C) 98.3 F (36.8 C) 98.8 F (37.1 C)  TempSrc:   Oral Oral  SpO2: 99%     Weight:      Height:  General: alert, cooperative and no distress Lochia: appropriate Uterine Fundus: firm Incision: N/A DVT Evaluation: No evidence of DVT seen on physical exam. Labs: RecentLabs       Lab Results  Component Value Date   WBC 24.4 (H) 08/15/2015   HGB 11.2 (L) 08/15/2015   HCT 32.7 (L) 08/15/2015   MCV 83.8 08/15/2015   PLT 231 08/15/2015     CMP Latest Ref Rng & Units 08/13/2015  Glucose 65 - 99 mg/dL 90  BUN 6 - 20 mg/dL 7  Creatinine 1.610.44 - 0.961.00 mg/dL 0.450.55  Sodium 409135 - 811145 mmol/L 136  Potassium 3.5 - 5.1 mmol/L 4.1  Chloride 101 - 111 mmol/L 107  CO2 22 - 32 mmol/L 22  Calcium 8.9 - 10.3 mg/dL 9.1  Total Protein 6.5 - 8.1 g/dL 6.5  Total Bilirubin 0.3 - 1.2 mg/dL 0.8  Alkaline Phos 38 - 126 U/L 141(H)  AST 15 - 41 U/L 25  ALT 14 - 54 U/L 18    Discharge instruction: per After Visit Summary and "Baby and Me Booklet".  After visit meds:    Medication List    TAKE these medications   acetaminophen 325 MG tablet Commonly known as:  TYLENOL Take 2 tablets (650 mg total) by mouth every 4 (four) hours as needed (for pain scale < 4).  ibuprofen 600 MG tablet Commonly known as:  ADVIL,MOTRIN Take 1 tablet (600 mg total) by mouth every 6 (six) hours.  prenatal multivitamin Tabs tablet Take 1 tablet by mouth daily at 12 noon.      Diet: routine diet  Activity: Advance as tolerated. Pelvic rest for 6 weeks.   Outpatient follow up:4-6 weeks Follow up Appt:No future appointments. Follow up Visit:No Follow-up on file.  Postpartum contraception: Undecided would like to discuss at her follow up visit  Newborn Data: Live born female  Birth Weight: 6 lb 3.8 oz (2829 g) APGAR: 8, 9  Baby Feeding: Breast Disposition:home with mother   08/17/2015, MD, PGY-

## 2015-08-25 ENCOUNTER — Encounter: Payer: Self-pay | Admitting: *Deleted

## 2015-09-24 ENCOUNTER — Ambulatory Visit (INDEPENDENT_AMBULATORY_CARE_PROVIDER_SITE_OTHER): Payer: Self-pay | Admitting: Family Medicine

## 2015-09-24 ENCOUNTER — Encounter: Payer: Self-pay | Admitting: Family Medicine

## 2015-09-24 DIAGNOSIS — N898 Other specified noninflammatory disorders of vagina: Secondary | ICD-10-CM

## 2015-09-24 DIAGNOSIS — Z3009 Encounter for other general counseling and advice on contraception: Secondary | ICD-10-CM

## 2015-09-24 NOTE — Addendum Note (Signed)
Addended by: Cheree DittoGRAHAM, Delayni Streed A on: 09/24/2015 03:55 PM   Modules accepted: Orders

## 2015-09-24 NOTE — Progress Notes (Addendum)
Subjective:     Shireen Quanracy Tandon is a 30 y.o. female who presents for a postpartum visit. She is 5 weeks postpartum following a spontaneous vaginal delivery. I have fully reviewed the prenatal and intrapartum course. The delivery was at 37 gestational weeks. Outcome: spontaneous vaginal delivery. Anesthesia: epidural. Postpartum course has been unremarkable. Baby's course has been unremarkable. Baby is feeding by breast. Bleeding staining only. Bowel function is normal. Bladder function is normal. Patient is not sexually active. Contraception method is none. Postpartum depression screening: negative.  BPs: Home BPs by baby love, states range have been: 120-130s/80s. (Unsure about diastolic). Denies HAs, CP, SOB.    The following portions of the patient's history were reviewed and updated as appropriate: allergies, current medications, past family history, past medical history, past social history, past surgical history and problem list.  Review of Systems Pertinent items noted in HPI and remainder of comprehensive ROS otherwise negative.   Objective:   Vitals:   09/24/15 1332 09/24/15 1400  BP: (!) 125/91 125/81  Pulse: 81   Weight: 177 lb 12.8 oz (80.6 kg)      BP 125/81   Pulse 81   Wt 177 lb 12.8 oz (80.6 kg)   Breastfeeding? Yes   BMI 28.70 kg/m   General:  alert, cooperative and appears stated age   Breasts:  inspection negative, no nipple discharge or bleeding, no masses or nodularity palpable and appropriate milk discharge  Lungs: clear to auscultation bilaterally  Heart:  regular rate and rhythm, S1, S2 normal, no murmur, click, rub or gallop  Abdomen: soft, non-tender; bowel sounds normal; no masses,  no organomegaly   Vulva:  normal  Vagina: vagina negative for erythema and vaginal tear/laceration healing and vagina positive for browish watery discharge, +odorous. Wet prep collected.  Cervix:  no cervical motion tenderness and no lesions  Corpus: normal  Adnexa:  normal  adnexa  Rectal Exam: Not performed.        Assessment:    1. Routine 5 week postpartum exam. Pap smear not done at today's visit.    2. Vaginal odor  Plan:    1. Contraception: Nexplanon, to be inserted in 2 weeks, patient wants to read up on this contraception. Discussed to use condoms in meantime if resuming intercourse. 2. Mildly elevated diastolic on first BP check today, repeat normal 125/81. Patient to bring her BP readings from visiting nurse to next visit. 3. Await wet prep results. 4. Follow up in: 2 weeks for nexplanon insertion or as needed.      Cleda ClarksElizabeth W. Almeda Ezra, DO  [facility] 09/24/2015 2:02 PM

## 2015-09-24 NOTE — Patient Instructions (Signed)
Postpartum Care After Vaginal Delivery °After you deliver your newborn (postpartum period), the usual stay in the hospital is 24-72 hours. If there were problems with your labor or delivery, or if you have other medical problems, you might be in the hospital longer.  °While you are in the hospital, you will receive help and instructions on how to care for yourself and your newborn during the postpartum period.  °While you are in the hospital: °· Be sure to tell your nurses if you have pain or discomfort, as well as where you feel the pain and what makes the pain worse. °· If you had an incision made near your vagina (episiotomy) or if you had some tearing during delivery, the nurses may put ice packs on your episiotomy or tear. The ice packs may help to reduce the pain and swelling. °· If you are breastfeeding, you may feel uncomfortable contractions of your uterus for a couple of weeks. This is normal. The contractions help your uterus get back to normal size. °· It is normal to have some bleeding after delivery. °¨ For the first 1-3 days after delivery, the flow is red and the amount may be similar to a period. °¨ It is common for the flow to start and stop. °¨ In the first few days, you may pass some small clots. Let your nurses know if you begin to pass large clots or your flow increases. °¨ Do not  flush blood clots down the toilet before having the nurse look at them. °¨ During the next 3-10 days after delivery, your flow should become more watery and pink or brown-tinged in color. °¨ Ten to fourteen days after delivery, your flow should be a small amount of yellowish-white discharge. °¨ The amount of your flow will decrease over the first few weeks after delivery. Your flow may stop in 6-8 weeks. Most women have had their flow stop by 12 weeks after delivery. °· You should change your sanitary pads frequently. °· Wash your hands thoroughly with soap and water for at least 20 seconds after changing pads, using  the toilet, or before holding or feeding your newborn. °· You should feel like you need to empty your bladder within the first 6-8 hours after delivery. °· In case you become weak, lightheaded, or faint, call your nurse before you get out of bed for the first time and before you take a shower for the first time. °· Within the first few days after delivery, your breasts may begin to feel tender and full. This is called engorgement. Breast tenderness usually goes away within 48-72 hours after engorgement occurs. You may also notice milk leaking from your breasts. If you are not breastfeeding, do not stimulate your breasts. Breast stimulation can make your breasts produce more milk. °· Spending as much time as possible with your newborn is very important. During this time, you and your newborn can feel close and get to know each other. Having your newborn stay in your room (rooming in) will help to strengthen the bond with your newborn.  It will give you time to get to know your newborn and become comfortable caring for your newborn. °· Your hormones change after delivery. Sometimes the hormone changes can temporarily cause you to feel sad or tearful. These feelings should not last more than a few days. If these feelings last longer than that, you should talk to your caregiver. °· If desired, talk to your caregiver about methods of family planning or contraception. °·   Talk to your caregiver about immunizations. Your caregiver may want you to have the following immunizations before leaving the hospital:  Tetanus, diphtheria, and pertussis (Tdap) or tetanus and diphtheria (Td) immunization. It is very important that you and your family (including grandparents) or others caring for your newborn are up-to-date with the Tdap or Td immunizations. The Tdap or Td immunization can help protect your newborn from getting ill.  Rubella immunization.  Varicella (chickenpox) immunization.  Influenza immunization. You should  receive this annual immunization if you did not receive the immunization during your pregnancy.   This information is not intended to replace advice given to you by your health care provider. Make sure you discuss any questions you have with your health care provider.   Document Released: 10/24/2006 Document Revised: 09/21/2011 Document Reviewed: 08/24/2011 Elsevier Interactive Patient Education 2016 [employer].   Storing Breast Milk Breast milk is a living fluid that contains infection-fighting cells (antibodies). Pre-pumped (expressed) breast milk needs to be stored in a certain way so that it remains effective in protecting your baby against infections. The following guidelines are for storing breast milk for a healthy, full-term infant.  HOW LONG CAN BREAST MILK BE STORED?  Milk can be stored for up to 4 hours at room temperature, or 60-51F (15.6-19.4C). However, it is acceptable to allow the milk to sit for 6-8 hours if the pump parts and containers are well cleaned.  Milk can be stored for 3 days in a refrigerator at less than 75F (3.9C). However, it is acceptable to allow the milk to sit for up to 8 days if the pump parts and containers are well cleaned.  Milk can be stored for 2 weeks in a freezer compartment inside a refrigerator.  Milk can be stored for 3-4 months in a freezer unit with a separate door.  Milk can be stored for 6-12 months in a deep freezer at -58F (-20C). A deep freezer is a chest or stand-alone freezer that is not opened very often and stays at a colder temperature. HOW SHOULD I STORE BREAST MILK?  Milk may be stored in a:  Glass container.  Hard plastic container.  Plastic bag specially designed for storing milk. Many women like these because they take up less space and can be attached directly to the breast pump.  Store your milk in 2-4 oz (60-120 mL) servings. This makes it easier to thaw the milk. It also helps you avoid having to throw out milk  your baby does not drink.  Leave an inch or so at the top of the bag or bottle so the milk has room to expand as it freezes.  Label each container with the date and time the milk was pumped so that you use the milk in the order it was pumped.  If you will be freezing the milk, store it in the back of the freezer. This prevents the milk from being affected by temperature changes due to the freezer door being opened. THAWING FROZEN BREAST MILK  Frozen milk can be thawed:  In a refrigerator.  Under warm-running tap water.  In a pan of warm water that has been heated on the stove.  Do not heat milk directly on the stove or in a microwave as this will destroy some of its infection-fighting properties.  Thawed milk can be stored in the refrigerator for up to 24 hours but should not be refrozen.  If you want to add freshly pumped milk to the frozen  milk, make sure to add less milk than what is already frozen. Chill the fresh milk in your refrigerator for 30 minutes before adding it to the milk in the freezer.   This information is not intended to replace advice given to you by your health care provider. Make sure you discuss any questions you have with your health care provider.   Document Released: 10/24/2008 Document Revised: 01/01/2013 Document Reviewed: 10/08/2012 Elsevier Interactive Patient Education 2016 [employer]   Etonogestrel implant What is this medicine? ETONOGESTREL (et oh noe JES trel) is a contraceptive (birth control) device. It is used to prevent pregnancy. It can be used for up to 3 years. This medicine may be used for other purposes; ask your health care provider or pharmacist if you have questions. What should I tell my health care provider before I take this medicine? They need to know if you have any of these conditions: -abnormal vaginal bleeding -blood vessel disease or blood clots -cancer of the breast, cervix, or liver -depression -diabetes -gallbladder  disease -headaches -heart disease or recent heart attack -high blood pressure -high cholesterol -kidney disease -liver disease -renal disease -seizures -tobacco smoker -an unusual or allergic reaction to etonogestrel, other hormones, anesthetics or antiseptics, medicines, foods, dyes, or preservatives -pregnant or trying to get pregnant -breast-feeding How should I use this medicine? This device is inserted just under the skin on the inner side of your upper arm by a health care professional. Talk to your pediatrician regarding the use of this medicine in children. Special care may be needed. Overdosage: If you think you have taken too much of this medicine contact a poison control center or emergency room at once. NOTE: This medicine is only for you. Do not share this medicine with others. What if I miss a dose? This does not apply. What may interact with this medicine? Do not take this medicine with any of the following medications: -amprenavir -bosentan -fosamprenavir This medicine may also interact with the following medications: -barbiturate medicines for inducing sleep or treating seizures -certain medicines for fungal infections like ketoconazole and itraconazole -griseofulvin -medicines to treat seizures like carbamazepine, felbamate, oxcarbazepine, phenytoin, topiramate -modafinil -phenylbutazone -rifampin -some medicines to treat HIV infection like atazanavir, indinavir, lopinavir, nelfinavir, tipranavir, ritonavir -St. John's wort This list may not describe all possible interactions. Give your health care provider a list of all the medicines, herbs, non-prescription drugs, or dietary supplements you use. Also tell them if you smoke, drink alcohol, or use illegal drugs. Some items may interact with your medicine. What should I watch for while using this medicine? This product does not protect you against HIV infection (AIDS) or other sexually transmitted diseases. You  should be able to feel the implant by pressing your fingertips over the skin where it was inserted. Contact your doctor if you cannot feel the implant, and use a non-hormonal birth control method (such as condoms) until your doctor confirms that the implant is in place. If you feel that the implant may have broken or become bent while in your arm, contact your healthcare provider. What side effects may I notice from receiving this medicine? Side effects that you should report to your doctor or health care professional as soon as possible: -allergic reactions like skin rash, itching or hives, swelling of the face, lips, or tongue -breast lumps -changes in emotions or moods -depressed mood -heavy or prolonged menstrual bleeding -pain, irritation, swelling, or bruising at the insertion site -scar at site of insertion -signs  of infection at the insertion site such as fever, and skin redness, pain or discharge -signs of pregnancy -signs and symptoms of a blood clot such as breathing problems; changes in vision; chest pain; severe, sudden headache; pain, swelling, warmth in the leg; trouble speaking; sudden numbness or weakness of the face, arm or leg -signs and symptoms of liver injury like dark yellow or brown urine; general ill feeling or flu-like symptoms; light-colored stools; loss of appetite; nausea; right upper belly pain; unusually weak or tired; yellowing of the eyes or skin -unusual vaginal bleeding, discharge -signs and symptoms of a stroke like changes in vision; confusion; trouble speaking or understanding; severe headaches; sudden numbness or weakness of the face, arm or leg; trouble walking; dizziness; loss of balance or coordination Side effects that usually do not require medical attention (Report these to your doctor or health care professional if they continue or are bothersome.): -acne -back pain -breast pain -changes in weight -dizziness -general ill feeling or flu-like  symptoms -headache -irregular menstrual bleeding -nausea -sore throat -vaginal irritation or inflammation This list may not describe all possible side effects. Call your doctor for medical advice about side effects. You may report side effects to FDA at 1-800-FDA-1088. Where should I keep my medicine? This drug is given in a hospital or clinic and will not be stored at home. NOTE: This sheet is a summary. It may not cover all possible information. If you have questions about this medicine, talk to your doctor, pharmacist, or health care provider.    2016, Elsevier/[employer]. (2013-10-11 14:07:06)

## 2015-09-25 LAB — WET PREP, GENITAL
Trich, Wet Prep: NONE SEEN
Yeast Wet Prep HPF POC: NONE SEEN

## 2016-01-17 LAB — OB RESULTS CONSOLE RPR
RPR: NONREACTIVE
RPR: NONREACTIVE

## 2016-01-17 LAB — OB RESULTS CONSOLE HEPATITIS B SURFACE ANTIGEN
HEP B S AG: NEGATIVE
Hepatitis B Surface Ag: NEGATIVE

## 2016-01-17 LAB — OB RESULTS CONSOLE HIV ANTIBODY (ROUTINE TESTING): HIV: NONREACTIVE

## 2016-01-17 LAB — SICKLE CELL SCREEN: Sickle Cell Screen: POSITIVE

## 2016-01-17 LAB — OB RESULTS CONSOLE PLATELET COUNT: Platelets: 227

## 2016-01-17 LAB — OB RESULTS CONSOLE HGB/HCT, BLOOD
HEMATOCRIT: 33
Hemoglobin: 11.3

## 2016-11-08 ENCOUNTER — Encounter: Payer: Self-pay | Admitting: Obstetrics and Gynecology

## 2016-11-08 ENCOUNTER — Ambulatory Visit (INDEPENDENT_AMBULATORY_CARE_PROVIDER_SITE_OTHER): Payer: Self-pay | Admitting: Obstetrics and Gynecology

## 2016-11-08 VITALS — BP 122/80 | HR 99 | Wt 214.6 lb

## 2016-11-08 DIAGNOSIS — O09292 Supervision of pregnancy with other poor reproductive or obstetric history, second trimester: Secondary | ICD-10-CM

## 2016-11-08 DIAGNOSIS — O0992 Supervision of high risk pregnancy, unspecified, second trimester: Secondary | ICD-10-CM

## 2016-11-08 DIAGNOSIS — Z331 Pregnant state, incidental: Secondary | ICD-10-CM

## 2016-11-08 DIAGNOSIS — O0990 Supervision of high risk pregnancy, unspecified, unspecified trimester: Secondary | ICD-10-CM

## 2016-11-08 DIAGNOSIS — O09899 Supervision of other high risk pregnancies, unspecified trimester: Secondary | ICD-10-CM

## 2016-11-08 DIAGNOSIS — D573 Sickle-cell trait: Secondary | ICD-10-CM

## 2016-11-08 DIAGNOSIS — O09299 Supervision of pregnancy with other poor reproductive or obstetric history, unspecified trimester: Secondary | ICD-10-CM | POA: Insufficient documentation

## 2016-11-08 DIAGNOSIS — Z283 Underimmunization status: Secondary | ICD-10-CM

## 2016-11-08 DIAGNOSIS — Z113 Encounter for screening for infections with a predominantly sexual mode of transmission: Secondary | ICD-10-CM

## 2016-11-08 DIAGNOSIS — O9989 Other specified diseases and conditions complicating pregnancy, childbirth and the puerperium: Secondary | ICD-10-CM

## 2016-11-08 NOTE — Patient Instructions (Signed)
Third Trimester of Pregnancy The third trimester is from week 28 through week 40 (months 7 through 9). The third trimester is a time when the unborn baby (fetus) is growing rapidly. At the end of the ninth month, the fetus is about 20 inches in length and weighs 6-10 pounds. Body changes during your third trimester Your body will continue to go through many changes during pregnancy. The changes vary from woman to woman. During the third trimester:  Your weight will continue to increase. You can expect to gain 25-35 pounds (11-16 kg) by the end of the pregnancy.  You may begin to get stretch marks on your hips, abdomen, and breasts.  You may urinate more often because the fetus is moving lower into your pelvis and pressing on your bladder.  You may develop or continue to have heartburn. This is caused by increased hormones that slow down muscles in the digestive tract.  You may develop or continue to have constipation because increased hormones slow digestion and cause the muscles that push waste through your intestines to relax.  You may develop hemorrhoids. These are swollen veins (varicose veins) in the rectum that can itch or be painful.  You may develop swollen, bulging veins (varicose veins) in your legs.  You may have increased body aches in the pelvis, back, or thighs. This is due to weight gain and increased hormones that are relaxing your joints.  You may have changes in your hair. These can include thickening of your hair, rapid growth, and changes in texture. Some women also have hair loss during or after pregnancy, or hair that feels dry or thin. Your hair will most likely return to normal after your baby is born.  Your breasts will continue to grow and they will continue to become tender. A yellow fluid (colostrum) may leak from your breasts. This is the first milk you are producing for your baby.  Your belly button may stick out.  You may notice more swelling in your hands,  face, or ankles.  You may have increased tingling or numbness in your hands, arms, and legs. The skin on your belly may also feel numb.  You may feel short of breath because of your expanding uterus.  You may have more problems sleeping. This can be caused by the size of your belly, increased need to urinate, and an increase in your body's metabolism.  You may notice the fetus "dropping," or moving lower in your abdomen (lightening).  You may have increased vaginal discharge.  You may notice your joints feel loose and you may have pain around your pelvic bone.  What to expect at prenatal visits You will have prenatal exams every 2 weeks until week 36. Then you will have weekly prenatal exams. During a routine prenatal visit:  You will be weighed to make sure you and the baby are growing normally.  Your blood pressure will be taken.  Your abdomen will be measured to track your baby's growth.  The fetal heartbeat will be listened to.  Any test results from the previous visit will be discussed.  You may have a cervical check near your due date to see if your cervix has softened or thinned (effaced).  You will be tested for Group B streptococcus. This happens between 35 and 37 weeks.  Your health care provider may ask you:  What your birth plan is.  How you are feeling.  If you are feeling the baby move.  If you have had   any abnormal symptoms, such as leaking fluid, bleeding, severe headaches, or abdominal cramping.  If you are using any tobacco products, including cigarettes, chewing tobacco, and electronic cigarettes.  If you have any questions.  Other tests or screenings that may be performed during your third trimester include:  Blood tests that check for low iron levels (anemia).  Fetal testing to check the health, activity level, and growth of the fetus. Testing is done if you have certain medical conditions or if there are problems during the  pregnancy.  Nonstress test (NST). This test checks the health of your baby to make sure there are no signs of problems, such as the baby not getting enough oxygen. During this test, a belt is placed around your belly. The baby is made to move, and its heart rate is monitored during movement.  What is false labor? False labor is a condition in which you feel small, irregular tightenings of the muscles in the womb (contractions) that usually go away with rest, changing position, or drinking water. These are called Braxton Hicks contractions. Contractions may last for hours, days, or even weeks before true labor sets in. If contractions come at regular intervals, become more frequent, increase in intensity, or become painful, you should see your health care provider. What are the signs of labor?  Abdominal cramps.  Regular contractions that start at 10 minutes apart and become stronger and more frequent with time.  Contractions that start on the top of the uterus and spread down to the lower abdomen and back.  Increased pelvic pressure and dull back pain.  A watery or bloody mucus discharge that comes from the vagina.  Leaking of amniotic fluid. This is also known as your "water breaking." It could be a slow trickle or a gush. Let your health care provider know if it has a color or strange odor. If you have any of these signs, call your health care provider right away, even if it is before your due date. Follow these instructions at home: Medicines  Follow your health care provider's instructions regarding medicine use. Specific medicines may be either safe or unsafe to take during pregnancy.  Take a prenatal vitamin that contains at least 600 micrograms (mcg) of folic acid.  If you develop constipation, try taking a stool softener if your health care provider approves. Eating and drinking  Eat a balanced diet that includes fresh fruits and vegetables, whole grains, good sources of protein  such as meat, eggs, or tofu, and low-fat dairy. Your health care provider will help you determine the amount of weight gain that is right for you.  Avoid raw meat and uncooked cheese. These carry germs that can cause birth defects in the baby.  If you have low calcium intake from food, talk to your health care provider about whether you should take a daily calcium supplement.  Eat four or five small meals rather than three large meals a day.  Limit foods that are high in fat and processed sugars, such as fried and sweet foods.  To prevent constipation: ? Drink enough fluid to keep your urine clear or pale yellow. ? Eat foods that are high in fiber, such as fresh fruits and vegetables, whole grains, and beans. Activity  Exercise only as directed by your health care provider. Most women can continue their usual exercise routine during pregnancy. Try to exercise for 30 minutes at least 5 days a week. Stop exercising if you experience uterine contractions.  Avoid heavy   lifting.  Do not exercise in extreme heat or humidity, or at high altitudes.  Wear low-heel, comfortable shoes.  Practice good posture.  You may continue to have sex unless your health care provider tells you otherwise. Relieving pain and discomfort  Take frequent breaks and rest with your legs elevated if you have leg cramps or low back pain.  Take warm sitz baths to soothe any pain or discomfort caused by hemorrhoids. Use hemorrhoid cream if your health care provider approves.  Wear a good support bra to prevent discomfort from breast tenderness.  If you develop varicose veins: ? Wear support pantyhose or compression stockings as told by your healthcare provider. ? Elevate your feet for 15 minutes, 3-4 times a day. Prenatal care  Write down your questions. Take them to your prenatal visits.  Keep all your prenatal visits as told by your health care provider. This is important. Safety  Wear your seat belt at  all times when driving.  Make a list of emergency phone numbers, including numbers for family, friends, the hospital, and police and fire departments. General instructions  Avoid cat litter boxes and soil used by cats. These carry germs that can cause birth defects in the baby. If you have a cat, ask someone to clean the litter box for you.  Do not travel far distances unless it is absolutely necessary and only with the approval of your health care provider.  Do not use hot tubs, steam rooms, or saunas.  Do not drink alcohol.  Do not use any products that contain nicotine or tobacco, such as cigarettes and e-cigarettes. If you need help quitting, ask your health care provider.  Do not use any medicinal herbs or unprescribed drugs. These chemicals affect the formation and growth of the baby.  Do not douche or use tampons or scented sanitary pads.  Do not cross your legs for long periods of time.  To prepare for the arrival of your baby: ? Take prenatal classes to understand, practice, and ask questions about labor and delivery. ? Make a trial run to the hospital. ? Visit the hospital and tour the maternity area. ? Arrange for maternity or paternity leave through employers. ? Arrange for family and friends to take care of pets while you are in the hospital. ? Purchase a rear-facing car seat and make sure you know how to install it in your car. ? Pack your hospital bag. ? Prepare the baby's nursery. Make sure to remove all pillows and stuffed animals from the baby's crib to prevent suffocation.  Visit your dentist if you have not gone during your pregnancy. Use a soft toothbrush to brush your teeth and be gentle when you floss. Contact a health care provider if:  You are unsure if you are in labor or if your water has broken.  You become dizzy.  You have mild pelvic cramps, pelvic pressure, or nagging pain in your abdominal area.  You have lower back pain.  You have persistent  nausea, vomiting, or diarrhea.  You have an unusual or bad smelling vaginal discharge.  You have pain when you urinate. Get help right away if:  Your water breaks before 37 weeks.  You have regular contractions less than 5 minutes apart before 37 weeks.  You have a fever.  You are leaking fluid from your vagina.  You have spotting or bleeding from your vagina.  You have severe abdominal pain or cramping.  You have rapid weight loss or weight gain.    You have shortness of breath with chest pain.  You notice sudden or extreme swelling of your face, hands, ankles, feet, or legs.  Your baby makes fewer than 10 movements in 2 hours.  You have severe headaches that do not go away when you take medicine.  You have vision changes. Summary  The third trimester is from week 28 through week 40, months 7 through 9. The third trimester is a time when the unborn baby (fetus) is growing rapidly.  During the third trimester, your discomfort may increase as you and your baby continue to gain weight. You may have abdominal, leg, and back pain, sleeping problems, and an increased need to urinate.  During the third trimester your breasts will keep growing and they will continue to become tender. A yellow fluid (colostrum) may leak from your breasts. This is the first milk you are producing for your baby.  False labor is a condition in which you feel small, irregular tightenings of the muscles in the womb (contractions) that eventually go away. These are called Braxton Hicks contractions. Contractions may last for hours, days, or even weeks before true labor sets in.  Signs of labor can include: abdominal cramps; regular contractions that start at 10 minutes apart and become stronger and more frequent with time; watery or bloody mucus discharge that comes from the vagina; increased pelvic pressure and dull back pain; and leaking of amniotic fluid. This information is not intended to replace advice  given to you by your health care provider. Make sure you discuss any questions you have with your health care provider. Document Released: 12/21/2000 Document Revised: 06/04/2015 Document Reviewed: 02/28/2012 Elsevier Interactive Patient Education  2017 [employer]  

## 2016-11-08 NOTE — Progress Notes (Signed)
Declined flu vaccine

## 2016-11-08 NOTE — Progress Notes (Signed)
   PRENATAL VISIT NOTE  Subjective:  Jacqueline [name] is a 31 y.o. G2P1001 at 81w3dbeing seen today for ongoing prenatal care.  She is currently monitored for the following issues for this low-risk pregnancy and has Supervision of high-risk pregnancy; Rubella non-immune status, antepartum; Ovarian cyst in pregnancy; Normal delivery; H/O pre-eclampsia in prior pregnancy, currently pregnant; and Sickle cell trait ([facility] on her problem list.  [place], brought records today. Normal anatomy, dated c/w early [place]  Patient reports occasional mild cramping.  Contractions: Not present. Vag. Bleeding: None.  Movement: Present. Denies leaking of fluid.   The following portions of the patient's history were reviewed and updated as appropriate: allergies, current medications, past family history, past medical history, past social history, past surgical history and problem list. Problem list updated.  Objective:   Vitals:   11/08/16 1501  BP: 122/80  Pulse: 99  Weight: 214 lb 9.6 oz (97.3 kg)    Fetal Status: Fetal Heart Rate (bpm): 164   Movement: Present     General:  Alert, oriented and cooperative. Patient is in no acute distress.  Skin: Skin is warm and dry. No rash noted.   Cardiovascular: Normal heart rate noted  Respiratory: Normal respiratory effort, no problems with respiration noted  Abdomen: Soft, gravid, appropriate for gestational age.  Pain/Pressure: Present     Pelvic: Cervical exam deferred        Extremities: Normal range of motion.  Edema: None  Mental Status:  Normal mood and affect. Normal behavior. Normal judgment and thought content.   Assessment and Plan:  Pregnancy: G2P1001 at 282w3d1. H/O pre-eclampsia in prior pregnancy, currently pregnant Was on baby ASA since early pregnancy, stopped but encouraged to start again BP normal today  2. Supervision of high risk pregnancy, antepartum GC/CT today  3. Rubella  non-immune status, antepartum Will need MMR post partum, did not receive after last delivery  4. Sick[place] cell trait ([place] today Reports FOB not sickle cell or trait  5. Late [place] of care [place]    Preterm labor symptoms and general obstetric precautions including but not limited to vaginal bleeding, contractions, leaking of fluid and fetal movement were reviewed in detail with the patient. Please refer to After Visit Summary for other counseling recommendations.  Return in about 2 weeks (around 11/22/2016) for 2 hr GTT, OB visit.   KeSloan LeiterMD

## 2016-11-10 LAB — GC/CHLAMYDIA PROBE AMP (~~LOC~~) NOT AT ARMC
CHLAMYDIA, DNA PROBE: NEGATIVE
NEISSERIA GONORRHEA: NEGATIVE

## 2016-11-10 LAB — CULTURE, OB URINE

## 2016-11-10 LAB — URINE CULTURE, OB REFLEX

## 2016-11-22 ENCOUNTER — Ambulatory Visit (INDEPENDENT_AMBULATORY_CARE_PROVIDER_SITE_OTHER): Payer: Self-pay | Admitting: Medical

## 2016-11-22 ENCOUNTER — Encounter: Payer: Self-pay | Admitting: Medical

## 2016-11-22 VITALS — BP 133/82 | HR 108 | Wt 215.6 lb

## 2016-11-22 DIAGNOSIS — Z23 Encounter for immunization: Secondary | ICD-10-CM

## 2016-11-22 DIAGNOSIS — O0990 Supervision of high risk pregnancy, unspecified, unspecified trimester: Secondary | ICD-10-CM

## 2016-11-22 DIAGNOSIS — O0993 Supervision of high risk pregnancy, unspecified, third trimester: Secondary | ICD-10-CM

## 2016-11-22 LAB — OB RESULTS CONSOLE HIV ANTIBODY (ROUTINE TESTING): HIV: NONREACTIVE

## 2016-11-22 LAB — OB RESULTS CONSOLE HEPATITIS B SURFACE ANTIGEN: Hepatitis B Surface Ag: NEGATIVE

## 2016-11-22 LAB — OB RESULTS CONSOLE RUBELLA ANTIBODY, IGM: Rubella: NON-IMMUNE/NOT IMMUNE

## 2016-11-22 LAB — OB RESULTS CONSOLE RPR: RPR: NONREACTIVE

## 2016-11-22 NOTE — Progress Notes (Signed)
   PRENATAL VISIT NOTE  Subjective:  Jacqueline [name] is a 31 y.o. G2P1001 at 3747w3d being seen today for ongoing prenatal care.  She is currently monitored for the following issues for this high-risk pregnancy and has Supervision of high-risk pregnancy; Rubella non-immune status, antepartum; Ovarian cyst in pregnancy; Normal delivery; H/O pre-eclampsia in prior pregnancy, currently pregnant; and Sickle cell trait (HCC) on their problem list.  Patient reports no complaints.  Contractions: Irritability. Vag. Bleeding: None.  Movement: Present. Denies leaking of fluid.   The following portions of the patient's history were reviewed and updated as appropriate: allergies, current medications, past family history, past medical history, past social history, past surgical history and problem list. Problem list updated.  Objective:   Vitals:   11/22/16 0849  BP: 133/82  Pulse: (!) 108  Weight: 215 lb 9.6 oz (97.8 kg)    Fetal Status: Fetal Heart Rate (bpm): 145 Fundal Height: 30 cm Movement: Present     General:  Alert, oriented and cooperative. Patient is in no acute distress.  Skin: Skin is warm and dry. No rash noted.   Cardiovascular: Normal heart rate noted  Respiratory: Normal respiratory effort, no problems with respiration noted  Abdomen: Soft, gravid, appropriate for gestational age.  Pain/Pressure: Absent     Pelvic: Cervical exam deferred        Extremities: Normal range of motion.  Edema: None  Mental Status:  Normal mood and affect. Normal behavior. Normal judgment and thought content.   Assessment and Plan:  Pregnancy: G2P1001 at 8747w3d  1. Supervision of high risk pregnancy, antepartum - Glucose Tolerance, 2 Hours w/1 Hour - HIV antibody - CBC - RPR - Tdap vaccine greater than or equal to 7yo IM  Preterm labor symptoms and general obstetric precautions including but not limited to vaginal bleeding, contractions, leaking of fluid and fetal movement were reviewed in detail  with the patient. Please refer to After Visit Summary for other counseling recommendations.  Return in about 2 weeks (around 12/06/2016) for LOB.   Vonzella NippleJulie Hilton Saephan, PA-C

## 2016-11-22 NOTE — Patient Instructions (Signed)
Fetal Movement Counts °Patient Name: ________________________________________________ Patient Due Date: ____________________ °What is a fetal movement count? °A fetal movement count is the number of times that you feel your baby move during a certain amount of time. This may also be called a fetal kick count. A fetal movement count is recommended for every pregnant woman. You may be asked to start counting fetal movements as early as week 28 of your pregnancy. °Pay attention to when your baby is most active. You may notice your baby's sleep and wake cycles. You may also notice things that make your baby move more. You should do a fetal movement count: °· When your baby is normally most active. °· At the same time each day. ° °A good time to count movements is while you are resting, after having something to eat and drink. °How do I count fetal movements? °1. Find a quiet, comfortable area. Sit, or lie down on your side. °2. Write down the date, the start time and stop time, and the number of movements that you felt between those two times. Take this information with you to your health care visits. °3. For 2 hours, count kicks, flutters, swishes, rolls, and jabs. You should feel at least 10 movements during 2 hours. °4. You may stop counting after you have felt 10 movements. °5. If you do not feel 10 movements in 2 hours, have something to eat and drink. Then, keep resting and counting for 1 hour. If you feel at least 4 movements during that hour, you may stop counting. °Contact a health care provider if: °· You feel fewer than 4 movements in 2 hours. °· Your baby is not moving like he or she usually does. °Date: ____________ Start time: ____________ Stop time: ____________ Movements: ____________ °Date: ____________ Start time: ____________ Stop time: ____________ Movements: ____________ °Date: ____________ Start time: ____________ Stop time: ____________ Movements: ____________ °Date: ____________ Start time:  ____________ Stop time: ____________ Movements: ____________ °Date: ____________ Start time: ____________ Stop time: ____________ Movements: ____________ °Date: ____________ Start time: ____________ Stop time: ____________ Movements: ____________ °Date: ____________ Start time: ____________ Stop time: ____________ Movements: ____________ °Date: ____________ Start time: ____________ Stop time: ____________ Movements: ____________ °Date: ____________ Start time: ____________ Stop time: ____________ Movements: ____________ °This information is not intended to replace advice given to you by your health care provider. Make sure you discuss any questions you have with your health care provider. °Document Released: 01/26/2006 Document Revised: 08/26/2015 Document Reviewed: 02/05/2015 °Elsevier Interactive Patient Education © 2018 [employer] °Braxton Hicks Contractions °Contractions of the uterus can occur throughout pregnancy, but they are not always a sign that you are in labor. You may have practice contractions called Braxton Hicks contractions. These false labor contractions are sometimes confused with true labor. °What are Braxton Hicks contractions? °Braxton Hicks contractions are tightening movements that occur in the muscles of the uterus before labor. Unlike true labor contractions, these contractions do not result in opening (dilation) and thinning of the cervix. Toward the end of pregnancy (32-34 weeks), Braxton Hicks contractions can happen more often and may become stronger. These contractions are sometimes difficult to tell apart from true labor because they can be very uncomfortable. You should not feel embarrassed if you go to the hospital with false labor. °Sometimes, the only way to tell if you are in true labor is for your health care provider to look for changes in the cervix. The health care provider will do a physical exam and may monitor your contractions. If   you are not in true labor, the exam  should show that your cervix is not dilating and your water has not broken. °If there are no prenatal problems or other health problems associated with your pregnancy, it is completely safe for you to be sent home with false labor. You may continue to have Braxton Hicks contractions until you go into true labor. °How can I tell the difference between true labor and false labor? °· Differences °? False labor °? Contractions last 30-70 seconds.: Contractions are usually shorter and not as strong as true labor contractions. °? Contractions become very regular.: Contractions are usually irregular. °? Discomfort is usually felt in the top of the uterus, and it spreads to the lower abdomen and low back.: Contractions are often felt in the front of the lower abdomen and in the groin. °? Contractions do not go away with walking.: Contractions may go away when you walk around or change positions while lying down. °? Contractions usually become more intense and increase in frequency.: Contractions get weaker and are shorter-lasting as time goes on. °? The cervix dilates and gets thinner.: The cervix usually does not dilate or become thin. °Follow these instructions at home: °· Take over-the-counter and prescription medicines only as told by your health care provider. °· Keep up with your usual exercises and follow other instructions from your health care provider. °· Eat and drink lightly if you think you are going into labor. °· If Braxton Hicks contractions are making you uncomfortable: °? Change your position from lying down or resting to walking, or change from walking to resting. °? Sit and rest in a tub of warm water. °? Drink enough fluid to keep your urine clear or pale yellow. Dehydration may cause these contractions. °? Do slow and deep breathing several times an hour. °· Keep all follow-up prenatal visits as told by your health care provider. This is important. °Contact a health care provider if: °· You have a  fever. °· You have continuous pain in your abdomen. °Get help right away if: °· Your contractions become stronger, more regular, and closer together. °· You have fluid leaking or gushing from your vagina. °· You pass blood-tinged mucus (bloody show). °· You have bleeding from your vagina. °· You have low back pain that you never had before. °· You feel your baby’s head pushing down and causing pelvic pressure. °· Your baby is not moving inside you as much as it used to. °Summary °· Contractions that occur before labor are called Braxton Hicks contractions, false labor, or practice contractions. °· Braxton Hicks contractions are usually shorter, weaker, farther apart, and less regular than true labor contractions. True labor contractions usually become progressively stronger and regular and they become more frequent. °· Manage discomfort from Braxton Hicks contractions by changing position, resting in a warm bath, drinking plenty of water, or practicing deep breathing. °This information is not intended to replace advice given to you by your health care provider. Make sure you discuss any questions you have with your health care provider. °Document Released: 12/27/2004 Document Revised: 11/16/2015 Document Reviewed: 11/16/2015 °Elsevier Interactive Patient Education © 2017 Elsevier Inc. ° °

## 2016-11-23 LAB — CBC
HEMATOCRIT: 34.3 % (ref 34.0–46.6)
HEMOGLOBIN: 11.1 g/dL (ref 11.1–15.9)
MCH: 28.1 pg (ref 26.6–33.0)
MCHC: 32.4 g/dL (ref 31.5–35.7)
MCV: 87 fL (ref 79–97)
Platelets: 230 10*3/uL (ref 150–379)
RBC: 3.95 x10E6/uL (ref 3.77–5.28)
RDW: 14.8 % (ref 12.3–15.4)
WBC: 10.4 10*3/uL (ref 3.4–10.8)

## 2016-11-23 LAB — GLUCOSE TOLERANCE, 2 HOURS W/ 1HR
GLUCOSE, 2 HOUR: 111 mg/dL (ref 65–152)
Glucose, 1 hour: 126 mg/dL (ref 65–179)
Glucose, Fasting: 78 mg/dL (ref 65–91)

## 2016-11-23 LAB — RPR: RPR Ser Ql: NONREACTIVE

## 2016-11-23 LAB — HIV ANTIBODY (ROUTINE TESTING W REFLEX): HIV Screen 4th Generation wRfx: NONREACTIVE

## 2016-11-25 ENCOUNTER — Encounter: Payer: Self-pay | Admitting: Medical

## 2016-12-09 ENCOUNTER — Ambulatory Visit (INDEPENDENT_AMBULATORY_CARE_PROVIDER_SITE_OTHER): Payer: Self-pay | Admitting: Obstetrics and Gynecology

## 2016-12-09 ENCOUNTER — Encounter: Payer: Self-pay | Admitting: Obstetrics and Gynecology

## 2016-12-09 ENCOUNTER — Encounter: Payer: Self-pay | Admitting: Medical

## 2016-12-09 VITALS — BP 119/76 | HR 105 | Wt 214.5 lb

## 2016-12-09 DIAGNOSIS — O0993 Supervision of high risk pregnancy, unspecified, third trimester: Secondary | ICD-10-CM

## 2016-12-09 DIAGNOSIS — O09899 Supervision of other high risk pregnancies, unspecified trimester: Secondary | ICD-10-CM

## 2016-12-09 DIAGNOSIS — O9989 Other specified diseases and conditions complicating pregnancy, childbirth and the puerperium: Secondary | ICD-10-CM

## 2016-12-09 DIAGNOSIS — D573 Sickle-cell trait: Secondary | ICD-10-CM

## 2016-12-09 DIAGNOSIS — O09299 Supervision of pregnancy with other poor reproductive or obstetric history, unspecified trimester: Secondary | ICD-10-CM

## 2016-12-09 DIAGNOSIS — Z283 Underimmunization status: Secondary | ICD-10-CM

## 2016-12-09 DIAGNOSIS — O09293 Supervision of pregnancy with other poor reproductive or obstetric history, third trimester: Secondary | ICD-10-CM

## 2016-12-09 NOTE — Progress Notes (Signed)
Subjective:  Jacqueline [name] is a 31 y.o. G2P1001 at 4190w6d being seen today for ongoing prenatal care.  She is currently monitored for the following issues for this low-risk pregnancy and has Supervision of high-risk pregnancy; Rubella non-immune status, antepartum; Ovarian cyst in pregnancy; Normal delivery; H/O pre-eclampsia in prior pregnancy, currently pregnant; and Sickle cell trait (HCC) on their problem list.  Patient reports no complaints.  Contractions: Irritability. Vag. Bleeding: None.  Movement: Present. Denies leaking of fluid.   The following portions of the patient's history were reviewed and updated as appropriate: allergies, current medications, past family history, past medical history, past social history, past surgical history and problem list. Problem list updated.  Objective:   Vitals:   12/09/16 1135  BP: 119/76  Pulse: (!) 105  Weight: 214 lb 8 oz (97.3 kg)    Fetal Status: Fetal Heart Rate (bpm): 150   Movement: Present     General:  Alert, oriented and cooperative. Patient is in no acute distress.  Skin: Skin is warm and dry. No rash noted.   Cardiovascular: Normal heart rate noted  Respiratory: Normal respiratory effort, no problems with respiration noted  Abdomen: Soft, gravid, appropriate for gestational age. Pain/Pressure: Present     Pelvic:  Cervical exam deferred        Extremities: Normal range of motion.  Edema: Trace  Mental Status: Normal mood and affect. Normal behavior. Normal judgment and thought content.   Urinalysis:      Assessment and Plan:  Pregnancy: G2P1001 at 190w6d  There are no diagnoses linked to this encounter. Preterm labor symptoms and general obstetric precautions including but not limited to vaginal bleeding, contractions, leaking of fluid and fetal movement were reviewed in detail with the patient. Please refer to After Visit Summary for other counseling recommendations.  Return in about 2 weeks (around 12/23/2016) for OB  visit.   Hermina StaggersErvin, Helio Lack L, MD

## 2016-12-09 NOTE — Patient Instructions (Signed)
Third Trimester of Pregnancy The third trimester is from week 28 through week 40 (months 7 through 9). The third trimester is a time when the unborn baby (fetus) is growing rapidly. At the end of the ninth month, the fetus is about 20 inches in length and weighs 6-10 pounds. Body changes during your third trimester Your body will continue to go through many changes during pregnancy. The changes vary from woman to woman. During the third trimester:  Your weight will continue to increase. You can expect to gain 25-35 pounds (11-16 kg) by the end of the pregnancy.  You may begin to get stretch marks on your hips, abdomen, and breasts.  You may urinate more often because the fetus is moving lower into your pelvis and pressing on your bladder.  You may develop or continue to have heartburn. This is caused by increased hormones that slow down muscles in the digestive tract.  You may develop or continue to have constipation because increased hormones slow digestion and cause the muscles that push waste through your intestines to relax.  You may develop hemorrhoids. These are swollen veins (varicose veins) in the rectum that can itch or be painful.  You may develop swollen, bulging veins (varicose veins) in your legs.  You may have increased body aches in the pelvis, back, or thighs. This is due to weight gain and increased hormones that are relaxing your joints.  You may have changes in your hair. These can include thickening of your hair, rapid growth, and changes in texture. Some women also have hair loss during or after pregnancy, or hair that feels dry or thin. Your hair will most likely return to normal after your baby is born.  Your breasts will continue to grow and they will continue to become tender. A yellow fluid (colostrum) may leak from your breasts. This is the first milk you are producing for your baby.  Your belly button may stick out.  You may notice more swelling in your hands,  face, or ankles.  You may have increased tingling or numbness in your hands, arms, and legs. The skin on your belly may also feel numb.  You may feel short of breath because of your expanding uterus.  You may have more problems sleeping. This can be caused by the size of your belly, increased need to urinate, and an increase in your body's metabolism.  You may notice the fetus "dropping," or moving lower in your abdomen (lightening).  You may have increased vaginal discharge.  You may notice your joints feel loose and you may have pain around your pelvic bone.  What to expect at prenatal visits You will have prenatal exams every 2 weeks until week 36. Then you will have weekly prenatal exams. During a routine prenatal visit:  You will be weighed to make sure you and the baby are growing normally.  Your blood pressure will be taken.  Your abdomen will be measured to track your baby's growth.  The fetal heartbeat will be listened to.  Any test results from the previous visit will be discussed.  You may have a cervical check near your due date to see if your cervix has softened or thinned (effaced).  You will be tested for Group B streptococcus. This happens between 35 and 37 weeks.  Your health care provider may ask you:  What your birth plan is.  How you are feeling.  If you are feeling the baby move.  If you have had   any abnormal symptoms, such as leaking fluid, bleeding, severe headaches, or abdominal cramping.  If you are using any tobacco products, including cigarettes, chewing tobacco, and electronic cigarettes.  If you have any questions.  Other tests or screenings that may be performed during your third trimester include:  Blood tests that check for low iron levels (anemia).  Fetal testing to check the health, activity level, and growth of the fetus. Testing is done if you have certain medical conditions or if there are problems during the  pregnancy.  Nonstress test (NST). This test checks the health of your baby to make sure there are no signs of problems, such as the baby not getting enough oxygen. During this test, a belt is placed around your belly. The baby is made to move, and its heart rate is monitored during movement.  What is false labor? False labor is a condition in which you feel small, irregular tightenings of the muscles in the womb (contractions) that usually go away with rest, changing position, or drinking water. These are called Braxton Hicks contractions. Contractions may last for hours, days, or even weeks before true labor sets in. If contractions come at regular intervals, become more frequent, increase in intensity, or become painful, you should see your health care provider. What are the signs of labor?  Abdominal cramps.  Regular contractions that start at 10 minutes apart and become stronger and more frequent with time.  Contractions that start on the top of the uterus and spread down to the lower abdomen and back.  Increased pelvic pressure and dull back pain.  A watery or bloody mucus discharge that comes from the vagina.  Leaking of amniotic fluid. This is also known as your "water breaking." It could be a slow trickle or a gush. Let your health care provider know if it has a color or strange odor. If you have any of these signs, call your health care provider right away, even if it is before your due date. Follow these instructions at home: Medicines  Follow your health care provider's instructions regarding medicine use. Specific medicines may be either safe or unsafe to take during pregnancy.  Take a prenatal vitamin that contains at least 600 micrograms (mcg) of folic acid.  If you develop constipation, try taking a stool softener if your health care provider approves. Eating and drinking  Eat a balanced diet that includes fresh fruits and vegetables, whole grains, good sources of protein  such as meat, eggs, or tofu, and low-fat dairy. Your health care provider will help you determine the amount of weight gain that is right for you.  Avoid raw meat and uncooked cheese. These carry germs that can cause birth defects in the baby.  If you have low calcium intake from food, talk to your health care provider about whether you should take a daily calcium supplement.  Eat four or five small meals rather than three large meals a day.  Limit foods that are high in fat and processed sugars, such as fried and sweet foods.  To prevent constipation: ? Drink enough fluid to keep your urine clear or pale yellow. ? Eat foods that are high in fiber, such as fresh fruits and vegetables, whole grains, and beans. Activity  Exercise only as directed by your health care provider. Most women can continue their usual exercise routine during pregnancy. Try to exercise for 30 minutes at least 5 days a week. Stop exercising if you experience uterine contractions.  Avoid heavy   lifting.  Do not exercise in extreme heat or humidity, or at high altitudes.  Wear low-heel, comfortable shoes.  Practice good posture.  You may continue to have sex unless your health care provider tells you otherwise. Relieving pain and discomfort  Take frequent breaks and rest with your legs elevated if you have leg cramps or low back pain.  Take warm sitz baths to soothe any pain or discomfort caused by hemorrhoids. Use hemorrhoid cream if your health care provider approves.  Wear a good support bra to prevent discomfort from breast tenderness.  If you develop varicose veins: ? Wear support pantyhose or compression stockings as told by your healthcare provider. ? Elevate your feet for 15 minutes, 3-4 times a day. Prenatal care  Write down your questions. Take them to your prenatal visits.  Keep all your prenatal visits as told by your health care provider. This is important. Safety  Wear your seat belt at  all times when driving.  Make a list of emergency phone numbers, including numbers for family, friends, the hospital, and police and fire departments. General instructions  Avoid cat litter boxes and soil used by cats. These carry germs that can cause birth defects in the baby. If you have a cat, ask someone to clean the litter box for you.  Do not travel far distances unless it is absolutely necessary and only with the approval of your health care provider.  Do not use hot tubs, steam rooms, or saunas.  Do not drink alcohol.  Do not use any products that contain nicotine or tobacco, such as cigarettes and e-cigarettes. If you need help quitting, ask your health care provider.  Do not use any medicinal herbs or unprescribed drugs. These chemicals affect the formation and growth of the baby.  Do not douche or use tampons or scented sanitary pads.  Do not cross your legs for long periods of time.  To prepare for the arrival of your baby: ? Take prenatal classes to understand, practice, and ask questions about labor and delivery. ? Make a trial run to the hospital. ? Visit the hospital and tour the maternity area. ? Arrange for maternity or paternity leave through employers. ? Arrange for family and friends to take care of pets while you are in the hospital. ? Purchase a rear-facing car seat and make sure you know how to install it in your car. ? Pack your hospital bag. ? Prepare the baby's nursery. Make sure to remove all pillows and stuffed animals from the baby's crib to prevent suffocation.  Visit your dentist if you have not gone during your pregnancy. Use a soft toothbrush to brush your teeth and be gentle when you floss. Contact a health care provider if:  You are unsure if you are in labor or if your water has broken.  You become dizzy.  You have mild pelvic cramps, pelvic pressure, or nagging pain in your abdominal area.  You have lower back pain.  You have persistent  nausea, vomiting, or diarrhea.  You have an unusual or bad smelling vaginal discharge.  You have pain when you urinate. Get help right away if:  Your water breaks before 37 weeks.  You have regular contractions less than 5 minutes apart before 37 weeks.  You have a fever.  You are leaking fluid from your vagina.  You have spotting or bleeding from your vagina.  You have severe abdominal pain or cramping.  You have rapid weight loss or weight gain.    You have shortness of breath with chest pain.  You notice sudden or extreme swelling of your face, hands, ankles, feet, or legs.  Your baby makes fewer than 10 movements in 2 hours.  You have severe headaches that do not go away when you take medicine.  You have vision changes. Summary  The third trimester is from week 28 through week 40, months 7 through 9. The third trimester is a time when the unborn baby (fetus) is growing rapidly.  During the third trimester, your discomfort may increase as you and your baby continue to gain weight. You may have abdominal, leg, and back pain, sleeping problems, and an increased need to urinate.  During the third trimester your breasts will keep growing and they will continue to become tender. A yellow fluid (colostrum) may leak from your breasts. This is the first milk you are producing for your baby.  False labor is a condition in which you feel small, irregular tightenings of the muscles in the womb (contractions) that eventually go away. These are called Braxton Hicks contractions. Contractions may last for hours, days, or even weeks before true labor sets in.  Signs of labor can include: abdominal cramps; regular contractions that start at 10 minutes apart and become stronger and more frequent with time; watery or bloody mucus discharge that comes from the vagina; increased pelvic pressure and dull back pain; and leaking of amniotic fluid. This information is not intended to replace advice  given to you by your health care provider. Make sure you discuss any questions you have with your health care provider. Document Released: 12/21/2000 Document Revised: 06/04/2015 Document Reviewed: 02/28/2012 Elsevier Interactive Patient Education  2017 [employer]  

## 2016-12-09 NOTE — Progress Notes (Signed)
Subjective:  Jacqueline [name] is a 31 y.o. G2P1001 at 5294w6d being seen today for ongoing prenatal care.  She is currently monitored for the following issues for this low-risk pregnancy and has Supervision of high-risk pregnancy; Rubella non-immune status, antepartum; H/O pre-eclampsia in prior pregnancy, currently pregnant; and Sickle cell trait (HCC) on their problem list.  Patient reports no complaints.  Contractions: Irritability. Vag. Bleeding: None.  Movement: Present. Denies leaking of fluid.   The following portions of the patient's history were reviewed and updated as appropriate: allergies, current medications, past family history, past medical history, past social history, past surgical history and problem list. Problem list updated.  Objective:   Vitals:   12/09/16 1135  BP: 119/76  Pulse: (!) 105  Weight: 214 lb 8 oz (97.3 kg)    Fetal Status: Fetal Heart Rate (bpm): 150 Fundal Height: 32 cm Movement: Present     General:  Alert, oriented and cooperative. Patient is in no acute distress.  Skin: Skin is warm and dry. No rash noted.   Cardiovascular: Normal heart rate noted  Respiratory: Normal respiratory effort, no problems with respiration noted  Abdomen: Soft, gravid, appropriate for gestational age. Pain/Pressure: Present     Pelvic:  Cervical exam deferred        Extremities: Normal range of motion.  Edema: Trace  Mental Status: Normal mood and affect. Normal behavior. Normal judgment and thought content.   Urinalysis:      Assessment and Plan:  Pregnancy: G2P1001 at 2894w6d  1. Supervision of high risk pregnancy in third trimester Stable  2. H/O pre-eclampsia in prior pregnancy, currently pregnant BP stable, no meds Continue with qd BASA  3. Sickle cell trait (HCC) UC negative  4. Rubella non-immune status, antepartum Vaccine PP  Preterm labor symptoms and general obstetric precautions including but not limited to vaginal bleeding, contractions, leaking of  fluid and fetal movement were reviewed in detail with the patient. Please refer to After Visit Summary for other counseling recommendations.  Return in about 2 weeks (around 12/23/2016) for OB visit.   Hermina StaggersErvin, Sonya Pucci L, MD

## 2016-12-21 ENCOUNTER — Encounter: Payer: Self-pay | Admitting: Obstetrics and Gynecology

## 2016-12-21 ENCOUNTER — Ambulatory Visit (INDEPENDENT_AMBULATORY_CARE_PROVIDER_SITE_OTHER): Payer: Self-pay | Admitting: Obstetrics and Gynecology

## 2016-12-21 VITALS — BP 135/83 | HR 98 | Wt 219.8 lb

## 2016-12-21 DIAGNOSIS — O0993 Supervision of high risk pregnancy, unspecified, third trimester: Secondary | ICD-10-CM

## 2016-12-21 DIAGNOSIS — O09299 Supervision of pregnancy with other poor reproductive or obstetric history, unspecified trimester: Secondary | ICD-10-CM

## 2016-12-21 DIAGNOSIS — Z283 Underimmunization status: Secondary | ICD-10-CM

## 2016-12-21 DIAGNOSIS — D573 Sickle-cell trait: Secondary | ICD-10-CM

## 2016-12-21 DIAGNOSIS — O09899 Supervision of other high risk pregnancies, unspecified trimester: Secondary | ICD-10-CM

## 2016-12-21 DIAGNOSIS — O9989 Other specified diseases and conditions complicating pregnancy, childbirth and the puerperium: Secondary | ICD-10-CM

## 2016-12-21 NOTE — Patient Instructions (Addendum)
Places to have your son circumcised:    [facility] [phone] $480 while you are in hospital  [facility] [phone] $244 by 4 wks  [facility] [phone] $175 by 2 wks  [facility] [phone] $250 by 7 days [facility] [phone] $150 by 4 wks  These prices sometimes change but are roughly what you can expect to pay. Please call and confirm pricing.   Circumcision is considered an elective/non-medically necessary procedure. There are many reasons parents decide to have their sons circumsized. During the first year of life circumcised males have a reduced risk of urinary tract infections but after this year the rates between circumcised males and uncircumcised males are the same.  It is safe to have your son circumcised outside of the hospital and the places above perform them regularly.   Contraception Choices Contraception (birth control) is the use of any methods or devices to prevent pregnancy. Below are some methods to help avoid pregnancy. Hormonal methods  Contraceptive implant. This is a thin, plastic tube containing progesterone hormone. It does not contain estrogen hormone. Your health care provider inserts the tube in the inner part of the upper arm. The tube can remain in place for up to 3 years. After 3 years, the implant must be removed. The implant prevents the ovaries from releasing an egg (ovulation), thickens the cervical mucus to prevent sperm from entering the uterus, and thins the lining of the inside of the uterus.  Progesterone-only injections. These injections are given every 3 months by your health care provider to prevent pregnancy. This synthetic progesterone hormone stops the ovaries from releasing eggs. It also thickens cervical mucus and changes the uterine lining. This makes it harder for sperm to survive  in the uterus.  Birth control pills. These pills contain estrogen and progesterone hormone. They work by preventing the ovaries from releasing eggs (ovulation). They also cause the cervical mucus to thicken, preventing the sperm from entering the uterus. Birth control pills are prescribed by a health care provider.Birth control pills can also be used to treat heavy periods.  Minipill. This type of birth control pill contains only the progesterone hormone. They are taken every day of each month and must be prescribed by your health care provider.  Birth control patch. The patch contains hormones similar to those in birth control pills. It must be changed once a week and is prescribed by a health care provider.  Vaginal ring. The ring contains hormones similar to those in birth control pills. It is left in the vagina for 3 weeks, removed for 1 week, and then a new one is put back in place. The patient must be comfortable inserting and removing the ring from the vagina.A health care provider's prescription is necessary.  Emergency contraception. Emergency contraceptives prevent pregnancy after unprotected sexual intercourse. This pill can be taken right after sex or up to 5 days after unprotected sex. It is most effective the sooner you take the pills after having sexual intercourse. Most emergency contraceptive pills are available without a prescription. Check with your pharmacist. Do not use emergency contraception as your only form of birth control. Barrier methods  Female condom. This is a thin sheath (latex or rubber) that is worn over the penis during sexual intercourse. It can be used with spermicide to increase effectiveness.  Female condom. This is a soft, loose-fitting sheath that is put into the vagina before sexual intercourse.  Diaphragm. This is a soft, latex, dome-shaped barrier that must be fitted by a health   care provider. It is inserted into the vagina, along with a spermicidal jelly.  It is inserted before intercourse. The diaphragm should be left in the vagina for 6 to 8 hours after intercourse.  Cervical cap. This is a round, soft, latex or plastic cup that fits over the cervix and must be fitted by a health care provider. The cap can be left in place for up to 48 hours after intercourse.  Sponge. This is a soft, circular piece of polyurethane foam. The sponge has spermicide in it. It is inserted into the vagina after wetting it and before sexual intercourse.  Spermicides. These are chemicals that kill or block sperm from entering the cervix and uterus. They come in the form of creams, jellies, suppositories, foam, or tablets. They do not require a prescription. They are inserted into the vagina with an applicator before having sexual intercourse. The process must be repeated every time you have sexual intercourse. Intrauterine contraception  Intrauterine device (IUD). This is a T-shaped device that is put in a woman's uterus during a menstrual period to prevent pregnancy. There are 2 types: ? Copper IUD. This type of IUD is wrapped in copper wire and is placed inside the uterus. Copper makes the uterus and fallopian tubes produce a fluid that kills sperm. It can stay in place for 10 years. ? Hormone IUD. This type of IUD contains the hormone progestin (synthetic progesterone). The hormone thickens the cervical mucus and prevents sperm from entering the uterus, and it also thins the uterine lining to prevent implantation of a fertilized egg. The hormone can weaken or kill the sperm that get into the uterus. It can stay in place for 3-5 years, depending on which type of IUD is used. Permanent methods of contraception  Female tubal ligation. This is when the woman's fallopian tubes are surgically sealed, tied, or blocked to prevent the egg from traveling to the uterus.  Hysteroscopic sterilization. This involves placing a small coil or insert into each fallopian tube. Your doctor  uses a technique called hysteroscopy to do the procedure. The device causes scar tissue to form. This results in permanent blockage of the fallopian tubes, so the sperm cannot fertilize the egg. It takes about 3 months after the procedure for the tubes to become blocked. You must use another form of birth control for these 3 months.  Female sterilization. This is when the female has the tubes that carry sperm tied off (vasectomy).This blocks sperm from entering the vagina during sexual intercourse. After the procedure, the man can still ejaculate fluid (semen). Natural planning methods  Natural family planning. This is not having sexual intercourse or using a barrier method (condom, diaphragm, cervical cap) on days the woman could become pregnant.  Calendar method. This is keeping track of the length of each menstrual cycle and identifying when you are fertile.  Ovulation method. This is avoiding sexual intercourse during ovulation.  Symptothermal method. This is avoiding sexual intercourse during ovulation, using a thermometer and ovulation symptoms.  Post-ovulation method. This is timing sexual intercourse after you have ovulated. Regardless of which type or method of contraception you choose, it is important that you use condoms to protect against the transmission of sexually transmitted infections (STIs). Talk with your health care provider about which form of contraception is most appropriate for you. This information is not intended to replace advice given to you by your health care provider. Make sure you discuss any questions you have with your health care provider.   Document Released: 12/27/2004 Document Revised: 06/04/2015 Document Reviewed: 06/21/2012 Elsevier Interactive Patient Education  2017 [employer] Inc.  

## 2016-12-21 NOTE — Progress Notes (Signed)
  [facility]   PRENATAL VISIT NOTE  Subjective:  Jacqueline [name] is a 31 y.o. G2P1001 at 1w4dbeing seen today for ongoing prenatal care.  She is currently monitored for the following issues for this high-risk pregnancy and has Supervision of high-risk pregnancy; Rubella non-immune status, antepartum; H/O pre-eclampsia in prior pregnancy, currently pregnant; and Sickle cell trait ([place] on their problem list.  Patient reports occasional contractions.  Contractions: Irritability. Vag. Bleeding: None.  Movement: Present. Denies leaking of fluid. She reports irregular but moderately painful contractions.  The following portions of the patient's history were reviewed and updated as appropriate: allergies, current medications, past family history, past medical history, past social history, past surgical history and problem list. Problem list updated.  Objective:   Vitals:   12/21/16 1356  BP: 135/83  Pulse: 98  Weight: 219 lb 12.8 oz (99.7 kg)    Fetal Status: Fetal Heart Rate (bpm): 155 Fundal Height: 34 cm Movement: Present     General:  Alert, oriented and cooperative. Patient is in no acute distress.  Skin: Skin is warm and dry. No rash noted.   Cardiovascular: Normal heart rate noted  Respiratory: Normal respiratory effort, no problems with respiration noted  Abdomen: Soft, gravid, appropriate for gestational age.  Pain/Pressure: Present     Pelvic: Cervical exam deferred        Extremities: Normal range of motion.  Edema: Trace  Mental Status:  Normal mood and affect. Normal behavior. Normal judgment and thought content.   Assessment and Plan:  Pregnancy: G2P1001 at 350w4d1. Supervision of high risk pregnancy in third trimester Counseled regarding risks/benefits of flu vaccine, patient would like to get next visit. Reviewed options for birth control including oral contraceptive pills (combination and progesterone only), NuvaRing, Depo-Provera, Nexplanon, IUDs (copper and  levonorgestrol). Thoroughly reviewed risks/benefits/side effects of each. Answered all questions. Patient undecided.  2. Rubella non-immune status, antepartum Needs MMR pp  3. H/O pre-eclampsia in prior pregnancy, currently pregnant On baby ASA  4. Sickle cell trait ([place]  Preterm labor symptoms and general obstetric precautions including but not limited to vaginal bleeding, contractions, leaking of fluid and fetal movement were reviewed in detail with the patient. Please refer to After Visit Summary for other counseling recommendations.  Return in about 2 weeks (around 01/04/2017) for OB visit.   KeSloan LeiterMD

## 2017-01-05 ENCOUNTER — Encounter: Payer: Self-pay | Admitting: Obstetrics and Gynecology

## 2017-01-10 NOTE — L&D Delivery Note (Signed)
Patient is 32 y.o. G2P1001 4459w4d admitted for SOL. S/p IOL Pitocin. AROM at 1437.  Prenatal course also complicated by pre-eclampsia without severe features, rubella non immune, h/o sickle cell trait.  Delivery Note At 11:03 PM a viable female was delivered via Vaginal, Spontaneous (Presentation: LOA ).  APGAR: 8, 9; weight pending  .   Placenta status: intact, spontaneous .  Cord:  3 vessel   Anesthesia:  Epidural  Episiotomy: None Lacerations:  None  Est. Blood Loss (mL): 200   Head delivered LOA. Nuchal cord present, easily reduced prior to delivery of shoulder. Shoulder and body delivered in usual fashion. Infant with spontaneous cry, placed on mother's abdomen, dried and bulb suctioned. Cord clamped x 2 after 1-minute delay, and cut by family member. Cord blood drawn. Placenta delivered spontaneously with gentle cord traction. Fundus firm with massage and Pitocin. Perineum inspected and found to have no laceration.   Mom to postpartum.  Baby to Couplet care / Skin to Skin.  Nocole Zammit Darin Engelsbraham 02/01/2017, 11:18 PM

## 2017-01-16 ENCOUNTER — Ambulatory Visit (INDEPENDENT_AMBULATORY_CARE_PROVIDER_SITE_OTHER): Payer: Self-pay | Admitting: Advanced Practice Midwife

## 2017-01-16 VITALS — BP 128/77 | HR 100 | Wt 215.5 lb

## 2017-01-16 DIAGNOSIS — Z113 Encounter for screening for infections with a predominantly sexual mode of transmission: Secondary | ICD-10-CM

## 2017-01-16 DIAGNOSIS — O0993 Supervision of high risk pregnancy, unspecified, third trimester: Secondary | ICD-10-CM

## 2017-01-16 LAB — OB RESULTS CONSOLE GBS: STREP GROUP B AG: NEGATIVE

## 2017-01-16 LAB — OB RESULTS CONSOLE GC/CHLAMYDIA: Gonorrhea: NEGATIVE

## 2017-01-16 NOTE — Progress Notes (Signed)
   PRENATAL VISIT NOTE  Subjective:  Jacqueline [name] is a [age] y.o. G2P1001 at 7586w2d being seen today for ongoing prenatal care.  She is currently monitored for the following issues for this high-risk pregnancy and has Supervision of high-risk pregnancy; Rubella non-immune status, antepartum; H/O pre-eclampsia in prior pregnancy, currently pregnant; and Sickle cell trait (HCC) on their problem list.  Patient reports no complaints.  Contractions: Irritability. Vag. Bleeding: None.  Movement: Present. Denies leaking of fluid.   The following portions of the patient's history were reviewed and updated as appropriate: allergies, current medications, past family history, past medical history, past social history, past surgical history and problem list. Problem list updated.  Objective:   Vitals:   01/16/17 1426  BP: 128/77  Pulse: 100  Weight: 215 lb 8 oz (97.8 kg)    Fetal Status: Fetal Heart Rate (bpm): 143 Fundal Height: 36 cm Movement: Present     General:  Alert, oriented and cooperative. Patient is in no acute distress.  Skin: Skin is warm and dry. No rash noted.   Cardiovascular: Normal heart rate noted  Respiratory: Normal respiratory effort, no problems with respiration noted  Abdomen: Soft, gravid, appropriate for gestational age.  Pain/Pressure: Present     Pelvic: Cervical exam deferred        Extremities: Normal range of motion.  Edema: None  Mental Status:  Normal mood and affect. Normal behavior. Normal judgment and thought content.   Assessment and Plan:  Pregnancy: G2P1001 at 7086w2d  1. Supervision of high risk pregnancy in third trimester - GBS today  - Patient asking if young child can be with her in the hospital. DW patient that the child can be here with her, but he cannot be alone with her. Someone must be here with the child while he is here with her.   Term labor symptoms and general obstetric precautions including but not limited to vaginal bleeding,  contractions, leaking of fluid and fetal movement were reviewed in detail with the patient. Please refer to After Visit Summary for other counseling recommendations.  Return in about 1 week (around 01/23/2017).   Thressa ShellerHeather Giannah Zavadil, CNM

## 2017-01-18 LAB — GC/CHLAMYDIA PROBE AMP (~~LOC~~) NOT AT ARMC
Chlamydia: NEGATIVE
NEISSERIA GONORRHEA: NEGATIVE

## 2017-01-20 LAB — CULTURE, BETA STREP (GROUP B ONLY): Strep Gp B Culture: NEGATIVE

## 2017-01-23 ENCOUNTER — Ambulatory Visit (INDEPENDENT_AMBULATORY_CARE_PROVIDER_SITE_OTHER): Payer: Self-pay | Admitting: Advanced Practice Midwife

## 2017-01-23 ENCOUNTER — Encounter: Payer: Self-pay | Admitting: Advanced Practice Midwife

## 2017-01-23 VITALS — BP 130/88 | HR 102 | Wt 218.3 lb

## 2017-01-23 DIAGNOSIS — O0993 Supervision of high risk pregnancy, unspecified, third trimester: Secondary | ICD-10-CM

## 2017-01-23 NOTE — Patient Instructions (Addendum)
  [facility] [place] [address] [place] [id number] [email or web address] Phone: [phone]  Local Doulas: Natural Baby Doulas [email or web address] Tel: [phone] [email or web address] [employer] [phone] [email or web address] The Labor Merla RichesLadies  (also do waterbirth tub rental) [phone] [email or web address] [facility] Doula [phone] [email or web address] [facility]  [phone] [email or web address] [employer] (PADA) [email or web address] [facility]  [email or web address]

## 2017-01-23 NOTE — Progress Notes (Signed)
   PRENATAL VISIT NOTE  Subjective:  Jacqueline [name] is a 32 y.o. G2P1001 at 9157w2d being seen today for ongoing prenatal care.  She is currently monitored for the following issues for this low-risk pregnancy and has Supervision of high-risk pregnancy; Rubella non-immune status, antepartum; H/O pre-eclampsia in prior pregnancy, currently pregnant; and Sickle cell trait (HCC) on their problem list.  Patient reports no complaints.  Contractions: Irregular. Vag. Bleeding: None.  Movement: Present. Denies leaking of fluid.   The following portions of the patient's history were reviewed and updated as appropriate: allergies, current medications, past family history, past medical history, past social history, past surgical history and problem list. Problem list updated.  Objective:   Vitals:   01/23/17 1555  BP: 130/88  Pulse: (!) 102  Weight: 218 lb 4.8 oz (99 kg)    Fetal Status: Fetal Heart Rate (bpm): 163 Fundal Height: 38 cm Movement: Present     General:  Alert, oriented and cooperative. Patient is in no acute distress.  Skin: Skin is warm and dry. No rash noted.   Cardiovascular: Normal heart rate noted  Respiratory: Normal respiratory effort, no problems with respiration noted  Abdomen: Soft, gravid, appropriate for gestational age.  Pain/Pressure: Absent     Pelvic: Cervical exam deferred        Extremities: Normal range of motion.  Edema: None  Mental Status:  Normal mood and affect. Normal behavior. Normal judgment and thought content.   Assessment and Plan:  Pregnancy: G2P1001 at 1957w2d  1. Supervision of high risk pregnancy in third trimester - Routine care - Info given on Doula's, and drop in child care as patient does not have any local support other than her sister. She does not have anyone to help her care for her young child while she is in the hospital.   Term labor symptoms and general obstetric precautions including but not limited to vaginal bleeding, contractions,  leaking of fluid and fetal movement were reviewed in detail with the patient. Please refer to After Visit Summary for other counseling recommendations.  Return in about 1 week (around 01/30/2017).   Thressa ShellerHeather Arlee Bossard, CNM

## 2017-01-25 ENCOUNTER — Telehealth: Payer: Self-pay | Admitting: Obstetrics and Gynecology

## 2017-01-25 NOTE — Telephone Encounter (Signed)
Patient called to say she needed a letter to be able to fly. She stated she would need help with her smaller kids, and needed to take them to [place]. [place] with Dr. Alysia PennaErvin, and stated patient should not fly at this time.

## 2017-02-01 ENCOUNTER — Inpatient Hospital Stay (HOSPITAL_COMMUNITY): Payer: Medicaid Other | Admitting: Anesthesiology

## 2017-02-01 ENCOUNTER — Other Ambulatory Visit: Payer: Self-pay

## 2017-02-01 ENCOUNTER — Encounter (HOSPITAL_COMMUNITY): Payer: Self-pay

## 2017-02-01 ENCOUNTER — Inpatient Hospital Stay (HOSPITAL_COMMUNITY)
Admission: AD | Admit: 2017-02-01 | Discharge: 2017-02-03 | DRG: 807 | Disposition: A | Discharge status: Home / Self Care | Payer: Medicaid Other | Source: Ambulatory Visit | Attending: Obstetrics & Gynecology | Admitting: Obstetrics & Gynecology

## 2017-02-01 DIAGNOSIS — O99214 Obesity complicating childbirth: Secondary | ICD-10-CM | POA: Diagnosis present

## 2017-02-01 DIAGNOSIS — O1404 Mild to moderate pre-eclampsia, complicating childbirth: Secondary | ICD-10-CM | POA: Diagnosis present

## 2017-02-01 DIAGNOSIS — D573 Sickle-cell trait: Secondary | ICD-10-CM | POA: Diagnosis present

## 2017-02-01 DIAGNOSIS — O1490 Unspecified pre-eclampsia, unspecified trimester: Secondary | ICD-10-CM

## 2017-02-01 DIAGNOSIS — O0990 Supervision of high risk pregnancy, unspecified, unspecified trimester: Secondary | ICD-10-CM

## 2017-02-01 DIAGNOSIS — O114 Pre-existing hypertension with pre-eclampsia, complicating childbirth: Secondary | ICD-10-CM

## 2017-02-01 DIAGNOSIS — O134 Gestational [pregnancy-induced] hypertension without significant proteinuria, complicating childbirth: Secondary | ICD-10-CM | POA: Diagnosis present

## 2017-02-01 DIAGNOSIS — O9902 Anemia complicating childbirth: Secondary | ICD-10-CM | POA: Diagnosis present

## 2017-02-01 DIAGNOSIS — E669 Obesity, unspecified: Secondary | ICD-10-CM | POA: Diagnosis present

## 2017-02-01 DIAGNOSIS — Z3A39 39 weeks gestation of pregnancy: Secondary | ICD-10-CM | POA: Diagnosis not present

## 2017-02-01 DIAGNOSIS — O09299 Supervision of pregnancy with other poor reproductive or obstetric history, unspecified trimester: Secondary | ICD-10-CM

## 2017-02-01 LAB — COMPREHENSIVE METABOLIC PANEL
ALT: 13 U/L — AB (ref 14–54)
AST: 20 U/L (ref 15–41)
Albumin: 3.4 g/dL — ABNORMAL LOW (ref 3.5–5.0)
Alkaline Phosphatase: 135 U/L — ABNORMAL HIGH (ref 38–126)
Anion gap: 9 (ref 5–15)
BUN: 5 mg/dL — AB (ref 6–20)
CO2: 20 mmol/L — AB (ref 22–32)
CREATININE: 0.47 mg/dL (ref 0.44–1.00)
Calcium: 8.9 mg/dL (ref 8.9–10.3)
Chloride: 107 mmol/L (ref 101–111)
GFR calc Af Amer: >60 mL/min (ref >60)
Glucose, Bld: 71 mg/dL (ref 65–99)
Potassium: 3.8 mmol/L (ref 3.5–5.1)
Sodium: 136 mmol/L (ref 135–145)
Total Bilirubin: 0.9 mg/dL (ref 0.3–1.2)
Total Protein: 7.5 g/dL (ref 6.5–8.1)

## 2017-02-01 LAB — CBC
HEMATOCRIT: 34.9 % — AB (ref 36.0–46.0)
HEMOGLOBIN: 11.4 g/dL — AB (ref 12.0–15.0)
MCH: 27.4 pg (ref 26.0–34.0)
MCHC: 32.7 g/dL (ref 30.0–36.0)
MCV: 83.9 fL (ref 78.0–100.0)
Platelets: 231 10*3/uL (ref 150–400)
RBC: 4.16 MIL/uL (ref 3.87–5.11)
RDW: 15.3 % (ref 11.5–15.5)
WBC: 11.6 10*3/uL — ABNORMAL HIGH (ref 4.0–10.5)

## 2017-02-01 LAB — TYPE AND SCREEN
ABO/RH(D): A POS
Antibody Screen: NEGATIVE

## 2017-02-01 LAB — PROTEIN / CREATININE RATIO, URINE
CREATININE, URINE: 287 mg/dL
Protein Creatinine Ratio: 0.39 mg/mg{Cre} — ABNORMAL HIGH (ref 0.00–0.15)
Total Protein, Urine: 112 mg/dL

## 2017-02-01 MED ORDER — DIPHENHYDRAMINE HCL 50 MG/ML IJ SOLN: 12.5000 mg | INTRAMUSCULAR | Status: DC | PRN

## 2017-02-01 MED ORDER — PHENYLEPHRINE 40 MCG/ML (10ML) SYRINGE FOR IV PUSH (FOR BLOOD PRESSURE SUPPORT)
80.0000 ug | PREFILLED_SYRINGE | INTRAVENOUS | Status: DC | PRN
  Filled 2017-02-01: qty 10
  Filled 2017-02-01: qty 5

## 2017-02-01 MED ORDER — LACTATED RINGERS IV SOLN
500.0000 mL | Freq: Once | INTRAVENOUS | Status: AC
  Administered 2017-02-01: 500 mL via INTRAVENOUS

## 2017-02-01 MED ORDER — FENTANYL 2.5 MCG/ML BUPIVACAINE 1/10 % EPIDURAL INFUSION (WH - ANES)
14.0000 mL/h | INTRAMUSCULAR | Status: DC | PRN
  Administered 2017-02-01 (×2): 14 mL/h via EPIDURAL
  Filled 2017-02-01 (×2): qty 100

## 2017-02-01 MED ORDER — LACTATED RINGERS IV SOLN
INTRAVENOUS | Status: DC
  Administered 2017-02-01 (×2): via INTRAVENOUS

## 2017-02-01 MED ORDER — FENTANYL CITRATE (PF) 100 MCG/2ML IJ SOLN
100.0000 ug | INTRAMUSCULAR | Status: DC | PRN
  Administered 2017-02-01: 100 ug via INTRAVENOUS
  Filled 2017-02-01: qty 2

## 2017-02-01 MED ORDER — LACTATED RINGERS IV SOLN: 500.0000 mL | Freq: Once | INTRAVENOUS | Status: DC

## 2017-02-01 MED ORDER — TERBUTALINE SULFATE 1 MG/ML IJ SOLN
0.2500 mg | Freq: Once | INTRAMUSCULAR | Status: DC | PRN
  Filled 2017-02-01: qty 1

## 2017-02-01 MED ORDER — OXYTOCIN 40 UNITS IN LACTATED RINGERS INFUSION - SIMPLE MED: 2.5000 [IU]/h | INTRAVENOUS | Status: DC

## 2017-02-01 MED ORDER — SOD CITRATE-CITRIC ACID 500-334 MG/5ML PO SOLN: 30.0000 mL | ORAL | Status: DC | PRN

## 2017-02-01 MED ORDER — PROMETHAZINE HCL 25 MG/ML IJ SOLN
12.5000 mg | Freq: Four times a day (QID) | INTRAMUSCULAR | Status: DC | PRN
Start: 2017-02-01 — End: 2017-02-02

## 2017-02-01 MED ORDER — LIDOCAINE HCL (PF) 1 % IJ SOLN
30.0000 mL | INTRAMUSCULAR | Status: DC | PRN
  Filled 2017-02-01: qty 30

## 2017-02-01 MED ORDER — LACTATED RINGERS IV SOLN: 500.0000 mL | INTRAVENOUS | Status: DC | PRN

## 2017-02-01 MED ORDER — LIDOCAINE HCL (PF) 1 % IJ SOLN
INTRAMUSCULAR | Status: DC | PRN
  Administered 2017-02-01 (×2): 4 mL via EPIDURAL

## 2017-02-01 MED ORDER — OXYTOCIN BOLUS FROM INFUSION
500.0000 mL | Freq: Once | INTRAVENOUS | Status: AC
  Administered 2017-02-01: 500 mL via INTRAVENOUS

## 2017-02-01 MED ORDER — OXYTOCIN 40 UNITS IN LACTATED RINGERS INFUSION - SIMPLE MED
1.0000 m[IU]/min | INTRAVENOUS | Status: DC
  Administered 2017-02-01: 2 m[IU]/min via INTRAVENOUS
  Filled 2017-02-01: qty 1000

## 2017-02-01 MED ORDER — EPHEDRINE 5 MG/ML INJ
10.0000 mg | INTRAVENOUS | Status: DC | PRN
  Filled 2017-02-01: qty 2

## 2017-02-01 MED ORDER — OXYCODONE-ACETAMINOPHEN 5-325 MG PO TABS: 2.0000 | ORAL_TABLET | ORAL | Status: DC | PRN

## 2017-02-01 MED ORDER — PHENYLEPHRINE 40 MCG/ML (10ML) SYRINGE FOR IV PUSH (FOR BLOOD PRESSURE SUPPORT)
80.0000 ug | PREFILLED_SYRINGE | INTRAVENOUS | Status: DC | PRN
  Filled 2017-02-01: qty 5

## 2017-02-01 MED ORDER — OXYCODONE-ACETAMINOPHEN 5-325 MG PO TABS: 1.0000 | ORAL_TABLET | ORAL | Status: DC | PRN

## 2017-02-01 MED ORDER — ONDANSETRON HCL 4 MG/2ML IJ SOLN
4.0000 mg | Freq: Four times a day (QID) | INTRAMUSCULAR | Status: DC | PRN
  Administered 2017-02-01 – 2017-02-02 (×3): 4 mg via INTRAVENOUS
  Filled 2017-02-01 (×3): qty 2

## 2017-02-01 MED ORDER — ACETAMINOPHEN 325 MG PO TABS: 650.0000 mg | ORAL_TABLET | ORAL | Status: DC | PRN

## 2017-02-01 NOTE — Progress Notes (Signed)
Labor Progress Note Jacqueline [name] is a 32 y.o. G2P1001 at 183w4d presented for SOL, c/b elevated BP's. S: No complaints at this time.  O:  BP (!) 120/91   Pulse 92   Temp 98.5 F (36.9 C) (Oral)   Resp 16   Ht 5\' 6"  (1.676 m)   Wt 98 kg (216 lb)   SpO2 100%   BMI 34.86 kg/m  EFM: 135/mod var/+accels, no decels Toco: CTX Q 3-454min  CVE: Dilation: 8.5 Effacement (%): 100 Presentation: Vertex Exam by:: Dr. Rushie GoltzLeland Results for orders placed or performed during the hospital encounter of 02/01/17 (from the past 24 hour(s))  Type and screen [facility]     Status: None   Collection Time: 02/01/17 11:29 AM  Result Value Ref Range   ABO/RH(D) A POS    Antibody Screen NEG    Sample Expiration 02/04/2017   CBC     Status: Abnormal   Collection Time: 02/01/17 11:43 AM  Result Value Ref Range   WBC 11.6 (H) 4.0 - 10.5 K/uL   RBC 4.16 3.87 - 5.11 MIL/uL   Hemoglobin 11.4 (L) 12.0 - 15.0 g/dL   HCT 16.134.9 (L) 09.636.0 - 04.546.0 %   MCV 83.9 78.0 - 100.0 fL   MCH 27.4 26.0 - 34.0 pg   MCHC 32.7 30.0 - 36.0 g/dL   RDW 40.915.3 81.111.5 - 91.415.5 %   Platelets 231 150 - 400 K/uL  Comprehensive metabolic panel     Status: Abnormal   Collection Time: 02/01/17 11:43 AM  Result Value Ref Range   Sodium 136 135 - 145 mmol/L   Potassium 3.8 3.5 - 5.1 mmol/L   Chloride 107 101 - 111 mmol/L   CO2 20 (L) 22 - 32 mmol/L   Glucose, Bld 71 65 - 99 mg/dL   BUN 5 (L) 6 - 20 mg/dL   Creatinine, Ser 7.820.47 0.44 - 1.00 mg/dL   Calcium 8.9 8.9 - 95.610.3 mg/dL   Total Protein 7.5 6.5 - 8.1 g/dL   Albumin 3.4 (L) 3.5 - 5.0 g/dL   AST 20 15 - 41 U/L   ALT 13 (L) 14 - 54 U/L   Alkaline Phosphatase 135 (H) 38 - 126 U/L   Total Bilirubin 0.9 0.3 - 1.2 mg/dL   GFR calc non Af Amer >60 >60 mL/min   GFR calc Af Amer >60 >60 mL/min   Anion gap 9 5 - 15     A&P: 32 y.o. G2P1001 6883w4d SOL with elevated BP's.  #Labor: Progressing well. ATOM 1435. Placed on peanut. Expect delivery soon #Pain: epidural  placed #FWB: Cat I NST #GBS negative   Alroy BailiffParker W Ameir Faria, MD 2:41 PM

## 2017-02-01 NOTE — Anesthesia Procedure Notes (Signed)
Epidural Patient location during procedure: OB Start time: 02/01/2017 2:02 PM  Staffing Anesthesiologist: Mal AmabileFoster, Georgina Krist, MD Performed: anesthesiologist   Preanesthetic Checklist Completed: patient identified, site marked, surgical consent, pre-op evaluation, timeout performed, IV checked, risks and benefits discussed and monitors and equipment checked  Epidural Patient position: sitting Prep: site prepped and draped and DuraPrep Patient monitoring: continuous pulse ox and blood pressure Approach: midline Location: L3-L4 Injection technique: LOR air  Needle:  Needle type: Tuohy  Needle gauge: 17 G Needle length: 9 cm and 9 Needle insertion depth: 5 cm cm Catheter type: closed end flexible Catheter size: 19 Gauge Catheter at skin depth: 10 cm Test dose: negative and Other  Assessment Events: blood not aspirated, injection not painful, no injection resistance, negative IV test and no paresthesia  Additional Notes Patient identified. Risks and benefits discussed including failed block, incomplete  Pain control, post dural puncture headache, nerve damage, paralysis, blood pressure Changes, nausea, vomiting, reactions to medications-both toxic and allergic and post Partum back pain. All questions were answered. Patient expressed understanding and wished to proceed. Sterile technique was used throughout procedure. Epidural site was Dressed with sterile barrier dressing. No paresthesias, signs of intravascular injection Or signs of intrathecal spread were encountered.  Patient was more comfortable after the epidural was dosed. Please see RN's note for documentation of vital signs and FHR which are stable.

## 2017-02-01 NOTE — H&P (Signed)
OBSTETRIC ADMISSION HISTORY AND PHYSICAL  Jacqueline [name] is a 32 y.o. female G2P1001 with IUP at 5829w4d by LMP c/w early U/s presenting for SOL with new GHTN. She reports +FMs, No LOF, no VB, no blurry vision, headaches or peripheral edema, and RUQ pain.  [place] in [place] until 27 weeks. She has a history of preeclampsia with prior pregnancy, she has been taking daily ASA to 37 wks. Rubella status non-immune. H/o sickle cell trait, FOB not sickle cell or trait per patient.. She plans on breast feeding. She request more info for birth control. Proven pelvis to 6lb 3oz.  [facility]  Dating: By LMP c/w early u/s --->  Estimated Date of Delivery: 02/04/17   Prenatal History/Complications:  Past Medical History: Past Medical History:  Diagnosis Date  . Preeclampsia    previous pregnancy    Past Surgical History: Past Surgical History:  Procedure Laterality Date  . NO PAST SURGERIES      Obstetrical History: OB History    Gravida Para Term Preterm AB Living   2 1 1     1    SAB TAB Ectopic Multiple Live Births         0 1      Social History: Social History   Socioeconomic History  . Marital status: Single    Spouse name: None  . Number of children: None  . Years of education: None  . Highest education level: None  Social Needs  . Financial resource strain: None  . Food insecurity - worry: None  . Food insecurity - inability: None  . Transportation needs - medical: None  . Transportation needs - non-medical: None  Occupational History  . None  Tobacco Use  . Smoking status: Never Smoker  . Smokeless tobacco: Never Used  Substance and Sexual Activity  . Alcohol use: No  . Drug use: No  . Sexual activity: Yes  Other Topics Concern  . None  Social History Narrative  . None    Family History: History reviewed. No pertinent family history.  Allergies: No Known Allergies  Medications Prior to Admission  Medication Sig Dispense  Refill Last Dose  . Prenatal Vit-Fe Fumarate-FA (PRENATAL MULTIVITAMIN) TABS tablet Take 1 tablet by mouth daily at 12 noon. 30 tablet 1 01/31/2017 at Unknown time     Review of Systems   All systems reviewed and negative except as stated in HPI  Blood pressure (!) 142/93, pulse 87, temperature 98.4 F (36.9 C), temperature source Oral, resp. rate 20, height 5\' 6"  (1.676 m), weight 98 kg (216 lb), SpO2 99 %, currently breastfeeding. General appearance: alert, cooperative, appears stated age and no distress Lungs: clear to auscultation bilaterally Heart: regular rate and rhythm Abdomen: soft, non-tender; bowel sounds normal Extremities: Homans sign is negative, no sign of DVT Presentation: cephalic Fetal monitoringBaseline: 140 bpm, Variability: Good {> 6 bpm), Accelerations: Reactive and Decelerations: Absent Uterine activityFrequency: Every 4 minutes Dilation: 4 Effacement (%): 100 Exam by:: Claudette Lawsjaton Burgess rnc   Prenatal labs: ABO, Rh:  A + Antibody:  neg Rubella:  nonimmune RPR: Non Reactive (11/13 1009)  HBsAg:   neg HIV: Non Reactive (11/13 1009)  GBS: Negative (01/07 0000)  3 hr Glucola WNL Genetic screening  WNL ([place]) Anatomy US WNL  Prenatal Transfer Tool  Maternal Diabetes: No Genetic Screening: Normal ([place]) Maternal Ultrasounds/Referrals: Normal Fetal Ultrasounds or other Referrals:  None Maternal Substance Abuse:  No Significant Maternal Medications:  Meds include: Other:  ASA Significant Maternal Lab Results: Lab values include: Group B Strep negative  No results found for this or any previous visit (from the past 24 hour(s)).  Patient Active Problem List   Diagnosis Date Noted  . H/O pre-eclampsia in prior pregnancy, currently pregnant 11/08/2016  . Sickle cell trait (HCC) 11/08/2016  . Rubella non-immune status, antepartum 05/27/2015  . Supervision of high-risk pregnancy 05/20/2015    Assessment/Plan:  Jacqueline [name] is a 32 y.o. G2P1001 at [redacted]w[redacted]d  here for SOL & new GHTN. C/b PreE in prior pregnancy, PNC in [place] to 27 weeks, sickle cell trait, rubella non-immune.  #Labor: Will start pitocin gtt. #Pain: Plans to request epidural #GHTN: preE labs ordered, h/o  #FWB: Reactive NST #ID:  GBS neg #MOF: breast #MOC:undecided #Circ:  Yes, outpatient  Alroy Bailiff, MD  02/01/2017, 11:12 AM

## 2017-02-01 NOTE — Anesthesia Preprocedure Evaluation (Signed)
Anesthesia Evaluation  Patient identified by MRN, date of birth, ID band Patient awake    Reviewed: Allergy & Precautions, Patient's Chart, lab work & pertinent test results  Airway Mallampati: II  TM Distance: >3 FB Neck ROM: Full    Dental no notable dental hx. (+) Teeth Intact   Pulmonary neg pulmonary ROS,    Pulmonary exam normal breath sounds clear to auscultation       Cardiovascular hypertension, Normal cardiovascular exam Rhythm:Regular Rate:Normal     Neuro/Psych negative neurological ROS  negative psych ROS   GI/Hepatic negative GI ROS, Neg liver ROS,   Endo/Other  Obesity  Renal/GU negative Renal ROS  negative genitourinary   Musculoskeletal negative musculoskeletal ROS (+)   Abdominal (+) + obese,   Peds  Hematology  (+) Sickle cell trait and anemia ,   Anesthesia Other Findings   Reproductive/Obstetrics (+) Pregnancy                             Anesthesia Physical Anesthesia Plan  ASA: II  Anesthesia Plan: Epidural   Post-op Pain Management:    Induction:   PONV Risk Score and Plan:   Airway Management Planned: Natural Airway  Additional Equipment:   Intra-op Plan:   Post-operative Plan:   Informed Consent: I have reviewed the patients History and Physical, chart, labs and discussed the procedure including the risks, benefits and alternatives for the proposed anesthesia with the patient or authorized representative who has indicated his/her understanding and acceptance.     Plan Discussed with: Anesthesiologist  Anesthesia Plan Comments:         Anesthesia Quick Evaluation

## 2017-02-01 NOTE — MAU Note (Signed)
Pt states UCs began approx 0500 this am & have become more frequent & intense since then. States noted VB at that time, but denies ROM.

## 2017-02-01 NOTE — Progress Notes (Signed)
Phlebotomy contacted to draw CBC & T&S.

## 2017-02-01 NOTE — Anesthesia Pain Management Evaluation Note (Signed)
  CRNA Pain Management Visit Note  Patient: Jacqueline [name], 32 y.o., female  "Hello [facility] am a [facility] the [facility]. We have an anesthesia team available at all times to provide care throughout the hospital, including epidural management and anesthesia for C-section. I don't know your plan for the delivery whether it a natural birth, water birth, IV sedation, nitrous supplementation, doula or epidural, but we want to meet your pain goals."   1.Was your pain managed to your expectations on prior hospitalizations?   Yes   2.What is your expectation for pain management during this hospitalization?   IV pain med, then   Epidural. Patient wants to wait on epidural because she wants to be able to walk to the bathroom now.  3.How can we help you reach that goal? IV pain med, epidural when desired.  Record the patient's initial score and the patient's pain goal.   Pain: 8  Pain Goal: 8 The [facility] wants you to be able to say your pain was always managed very well.  [name] 02/01/2017

## 2017-02-01 NOTE — Progress Notes (Signed)
Jacqueline [name] is a 32 y.o. G2P1001 at 4246w4d by LMP admitted for active labor  Subjective: Doing well, no questions or concerns. Support at bedside.   Objective: BP 129/86   Pulse 94   Temp 98.5 F (36.9 C) (Oral)   Resp 19   Ht 5\' 6"  (1.676 m)   Wt 98 kg (216 lb)   SpO2 100%   BMI 34.86 kg/m  No intake/output data recorded. No intake/output data recorded.  FHT:  FHR: 145 bpm, variability: moderate,  accelerations:  Present,  decelerations:  Present early UC:   regular, every 2-3 minutes SVE:   Dilation: 9 Effacement (%): 80 Station: -2 Exam by:: Dorathy DaftShay Payne RN   Labs: Lab Results  Component Value Date   WBC 11.6 (H) 02/01/2017   HGB 11.4 (L) 02/01/2017   HCT 34.9 (L) 02/01/2017   MCV 83.9 02/01/2017   PLT 231 02/01/2017    Assessment / Plan: Spontaneous labor, progressing normally  Labor: will continue pitocin Preeclampsia:  no signs or symptoms of toxicity and labs stable, Pr:Cr 0.39 Fetal Wellbeing:  Category I Pain Control:  Epidural I/D:  n/a Anticipated MOD:  NSVD  Poseidon Pam 02/01/2017, 9:15 PM

## 2017-02-02 ENCOUNTER — Encounter: Payer: Self-pay | Admitting: Obstetrics & Gynecology

## 2017-02-02 ENCOUNTER — Encounter (HOSPITAL_COMMUNITY): Payer: Self-pay

## 2017-02-02 LAB — RPR: RPR Ser Ql: NONREACTIVE

## 2017-02-02 MED ORDER — WITCH HAZEL-GLYCERIN EX PADS: 1.0000 "application " | MEDICATED_PAD | CUTANEOUS | Status: DC | PRN

## 2017-02-02 MED ORDER — ONDANSETRON HCL 4 MG PO TABS: 4.0000 mg | ORAL_TABLET | ORAL | Status: DC | PRN

## 2017-02-02 MED ORDER — SIMETHICONE 80 MG PO CHEW: 80.0000 mg | CHEWABLE_TABLET | ORAL | Status: DC | PRN

## 2017-02-02 MED ORDER — IBUPROFEN 600 MG PO TABS
600.0000 mg | ORAL_TABLET | Freq: Four times a day (QID) | ORAL | Status: DC
  Administered 2017-02-02 – 2017-02-03 (×8): 600 mg via ORAL
  Filled 2017-02-02 (×8): qty 1

## 2017-02-02 MED ORDER — SENNOSIDES-DOCUSATE SODIUM 8.6-50 MG PO TABS
2.0000 | ORAL_TABLET | ORAL | Status: DC
  Administered 2017-02-02 – 2017-02-03 (×2): 2 via ORAL
  Filled 2017-02-02 (×3): qty 2

## 2017-02-02 MED ORDER — TETANUS-DIPHTH-ACELL PERTUSSIS 5-2.5-18.5 LF-MCG/0.5 IM SUSP: 0.5000 mL | Freq: Once | INTRAMUSCULAR | Status: DC

## 2017-02-02 MED ORDER — ACETAMINOPHEN 325 MG PO TABS: 650.0000 mg | ORAL_TABLET | ORAL | Status: DC | PRN

## 2017-02-02 MED ORDER — PRENATAL MULTIVITAMIN CH
1.0000 | ORAL_TABLET | Freq: Every day | ORAL | Status: DC
  Administered 2017-02-02 – 2017-02-03 (×2): 1 via ORAL
  Filled 2017-02-02 (×2): qty 1

## 2017-02-02 MED ORDER — ZOLPIDEM TARTRATE 5 MG PO TABS: 5.0000 mg | ORAL_TABLET | Freq: Every evening | ORAL | Status: DC | PRN

## 2017-02-02 MED ORDER — COCONUT OIL OIL
1.0000 "application " | TOPICAL_OIL | Status: DC | PRN
  Administered 2017-02-02: 1 via TOPICAL
  Filled 2017-02-02: qty 120

## 2017-02-02 MED ORDER — MEASLES, MUMPS & RUBELLA VAC ~~LOC~~ INJ
0.5000 mL | INJECTION | Freq: Once | SUBCUTANEOUS | Status: AC
  Administered 2017-02-03: 0.5 mL via SUBCUTANEOUS
  Filled 2017-02-02 (×2): qty 0.5

## 2017-02-02 MED ORDER — ONDANSETRON HCL 4 MG/2ML IJ SOLN: 4.0000 mg | INTRAMUSCULAR | Status: DC | PRN

## 2017-02-02 MED ORDER — TRIAMTERENE-HCTZ 37.5-25 MG PO TABS
1.0000 | ORAL_TABLET | Freq: Every day | ORAL | Status: DC
  Administered 2017-02-02 – 2017-02-03 (×2): 1 via ORAL
  Filled 2017-02-02 (×3): qty 1

## 2017-02-02 MED ORDER — DIBUCAINE 1 % RE OINT
1.0000 "application " | TOPICAL_OINTMENT | RECTAL | Status: DC | PRN
  Administered 2017-02-02 – 2017-02-03 (×2): 1 via RECTAL
  Filled 2017-02-02 (×2): qty 28

## 2017-02-02 MED ORDER — BENZOCAINE-MENTHOL 20-0.5 % EX AERO
1.0000 "application " | INHALATION_SPRAY | CUTANEOUS | Status: DC | PRN
  Filled 2017-02-02: qty 56

## 2017-02-02 MED ORDER — DIPHENHYDRAMINE HCL 25 MG PO CAPS: 25.0000 mg | ORAL_CAPSULE | Freq: Four times a day (QID) | ORAL | Status: DC | PRN

## 2017-02-02 NOTE — Progress Notes (Signed)
Patient ID: Sacred Roa, female   DOB: December 15, 1985, 32 y.o.   MRN: [id number]  POSTPARTUM PROGRESS NOTE  Post Partum Day #1 (<24hrs)  Subjective:  Chasitie Passey is a 32 y.o. G2P2002 s/p SVD at [redacted]w[redacted]d Pregnancy complicated by PreE w/o sever features, not on BP Meds. No acute events overnight.  Pt denies problems with ambulating, voiding or po intake.  She denies nausea or vomiting.  Pain is well controlled.  She has had flatus. She has not had bowel movement.  Lochia Minimal. Has seen lactation, baby feeding well.  Objective: Blood pressure 136/86, pulse (!) 107, temperature 98.8 F (37.1 C), temperature source Oral, resp. rate 20, height 5' 6"  (1.676 m), weight 94.3 kg (208 lb), SpO2 100 %, unknown if currently breastfeeding.  Physical Exam:  General: alert, cooperative and no distress Chest: no respiratory distress, CTAB  Heart:regular rate and rhythm, distal pulses intact Abdomen: soft, nontender Uterine Fundus: firm, appropriately tender DVT Evaluation: No calf swelling or tenderness.  Extremities: Warm pink and dry. No edema  Recent Labs    02/01/17 1143  HGB 11.4*  HCT 34.9*    Assessment/Plan: TVanity Larssonis a 32y.o. [id number]/p SVD at 367w4dPregnancy complicated by PreE w/o sever features. No BP Meds.  PPD#1 - Doing well. Pain controlled.  Continue current care. Monitor BPs throughout the day. Rubella nonimmune: MMR scheduled PP  Contraception: Nexplanon Feeding: Breast Circ: Outpatient, list of clinicians provided  Dispo: Plan for discharge tomorrow.   LOS: 1 day   NaRulon Eisenmenger/24/2019, 9:04 AM

## 2017-02-02 NOTE — Progress Notes (Signed)
Dr Darin EngelsAbraham notified of pt's ^ BP- to recheck manually at 0600 and notify MD.  Pt is asymptotmatic at this time

## 2017-02-02 NOTE — Lactation Note (Signed)
This note was copied from a baby's chart. Lactation Consultation Note Baby 7 hrs old. Mom stated baby is BF well. Occasionally baby will slip to the end of the nipple and it will hurt. Encouraged un-swaddle, STS, chin tug, obtain a deep latch, cheeks to breast. Encouraged mom to assess breast before and after feeding for transfer.  Mom has 5915 months old that she pump and bottle fed for 6 months. Mom stated her milk was delayed coming in. Mom stated when it came in it was a lot. Mom stated she didn't want to pump with this baby she only wants to BF him. Newborn feeding habits, STS, I&O, supply and demand discussed.  Mom encouraged to feed baby 8-12 times/24 hours and with feeding cues.  Encouraged to call for assistance or questions. [facility] brochure given w/resources, support groups and LC services.  Patient Name: Jacqueline [name] NGEXB'MToday's Date: 02/02/2017 Reason for consult: Initial assessment   Maternal Data Has patient been taught Hand Expression?: Yes Does the patient have breastfeeding experience prior to this delivery?: Yes  Feeding Feeding Type: Breast Fed Length of feed: 15 min  LATCH Score                   Interventions Interventions: Breast feeding basics reviewed  Lactation Tools Discussed/Used WIC Program: Yes   Consult Status Consult Status: Follow-up Date: 02/03/17    Charyl DancerCARVER, Shakeema Lippman G 02/02/2017, 6:33 AM

## 2017-02-02 NOTE — Progress Notes (Signed)
Dr Darin EngelsAbraham notified- no new orders received

## 2017-02-02 NOTE — Anesthesia Postprocedure Evaluation (Signed)
Anesthesia Post Note  Patient: Jacqueline [name]  Procedure(s) Performed: AN AD HOC LABOR EPIDURAL     Patient location during evaluation: Mother Baby Anesthesia Type: Epidural Level of consciousness: awake and alert Pain management: pain level controlled Vital Signs Assessment: post-procedure vital signs reviewed and stable Respiratory status: spontaneous breathing, nonlabored ventilation and respiratory function stable Cardiovascular status: stable Postop Assessment: no headache, no backache and epidural receding Anesthetic complications: no    Last Vitals:  Vitals:   02/02/17 0535 02/02/17 0610  BP: (!) 151/93 136/86  Pulse: (!) 107   Resp: 20   Temp: 37.1 C   SpO2:      Last Pain:  Vitals:   02/02/17 0615  TempSrc:   PainSc: 3    Pain Goal:                 EchoStarMERRITT,Blakleigh Straw

## 2017-02-03 DIAGNOSIS — O1490 Unspecified pre-eclampsia, unspecified trimester: Secondary | ICD-10-CM

## 2017-02-03 MED ORDER — IBUPROFEN 600 MG PO TABS: 600.0000 mg | ORAL_TABLET | Freq: Four times a day (QID) | ORAL | 0 refills | Status: AC

## 2017-02-03 NOTE — Discharge Summary (Signed)
OB Discharge Summary     Patient Name: Jacqueline [name] Termini DOB: 08-23-1985 MRN: [id number]  Date of admission: 02/01/2017 Delivering MD: Cam HaiSHAW, KIMBERLY D   Date of discharge: 02/03/2017  Admitting diagnosis: 39WKS, LABOR Intrauterine pregnancy: 6475w4d     Secondary diagnosis:  Principal Problem:   Vaginal delivery Active Problems:   Supervision of high-risk pregnancy   H/O pre-eclampsia in prior pregnancy, currently pregnant   Sickle cell trait (HCC)   Indication for care in labor and delivery, antepartum   Preeclampsia    Discharge diagnosis: Term Pregnancy Delivered and Preeclampsia (mild)                                                                                                Post partum procedures:n/a  Augmentation: AROM  Complications: None  Hospital course:  Onset of Labor With Vaginal Delivery     [age] y.o. yo [id number] at 5175w4d was admitted in Active Labor on 02/01/2017 and new diagnosis of pre-eclampsia. Patient had an uncomplicated labor course as follows:  Membrane Rupture Time/Date: 2:37 PM ,02/01/2017   Intrapartum Procedures: Episiotomy: None [1]                                         Lacerations:  None [1]  Patient had a delivery of a Viable infant. 02/01/2017  Information for the patient's newborn:  Joylene IgoBannerman, Boy Jaki [[id number]][[id number]]  Delivery Method: Vaginal, Spontaneous(Filed from Delivery Summary)    Postpartum, she had persistently elevated blood pressures (140s-150s/80s), so Maxzide was started. BP on day of discharge was 120s-70s-80s. She will have a 1-week BP check. She is ambulating, tolerating a regular diet, passing flatus, and urinating well. Patient is discharged home in stable condition on 02/03/17.   Physical exam  Vitals:   02/02/17 1003 02/02/17 1900 02/03/17 0552 02/03/17 0554  BP: 131/82 118/70 122/83   Pulse: (!) 115 98 93   Resp:  20 20   Temp:  98.9 F (37.2 C) 98.4 F (36.9 C)   TempSrc:  Oral Oral   SpO2:      Weight:    92.9 kg  (204 lb 12.8 oz)  Height:       General: alert, cooperative and no distress Lochia: appropriate Uterine Fundus: firm Incision: N/A DVT Evaluation: No evidence of DVT seen on physical exam. No significant calf/ankle edema. Labs: Lab Results  Component Value Date   WBC 11.6 (H) 02/01/2017   HGB 11.4 (L) 02/01/2017   HCT 34.9 (L) 02/01/2017   MCV 83.9 02/01/2017   PLT 231 02/01/2017   CMP Latest Ref Rng & Units 02/01/2017  Glucose 65 - 99 mg/dL 71  BUN 6 - 20 mg/dL 5(L)  Creatinine 9.620.44 - 1.00 mg/dL 9.520.47  Sodium 841135 - 324145 mmol/L 136  Potassium 3.5 - 5.1 mmol/L 3.8  Chloride 101 - 111 mmol/L 107  CO2 22 - 32 mmol/L 20(L)  Calcium 8.9 - 10.3 mg/dL 8.9  Total Protein 6.5 - 8.1 g/dL 7.5  Total Bilirubin 0.3 -  1.2 mg/dL 0.9  Alkaline Phos 38 - 126 U/L 135(H)  AST 15 - 41 U/L 20  ALT 14 - 54 U/L 13(L)    Discharge instruction: per After Visit Summary and "Baby and Me Booklet".  After visit meds:  Allergies as of 02/03/2017   No Known Allergies     Medication List    STOP taking these medications   prenatal multivitamin Tabs tablet     TAKE these medications   ibuprofen 600 MG tablet Commonly known as:  ADVIL,MOTRIN Take 1 tablet (600 mg total) by mouth every 6 (six) hours.       Diet: low salt diet  Activity: Advance as tolerated. Pelvic rest for 6 weeks.   Outpatient follow up:4 weeks, BP check in 5 days Follow up Appt: Future Appointments  Date Time Provider Department Center  02/08/2017  3:55 PM Hermina Staggers, MD [facility]   Follow up Visit:No Follow-up on file.  Postpartum contraception: Undecided  Newborn Data: Live born female  Birth Weight: 7 lb 6.5 oz (3360 g) APGAR: 8, 9  Newborn Delivery   Birth date/time:  02/01/2017 23:03:00 Delivery type:  Vaginal, Spontaneous     Baby Feeding: Breast Disposition:home with mother   02/03/2017 Alroy Bailiff, MD   OB FELLOW DISCHARGE ATTESTATION  I have seen and examined this patient and  agree with above documentation in the resident's note.  Pt discharged on BP medication and 1-wk BP follow up. Doing well and VS nl at time of discharge  Frederik Pear, MD OB Fellow 1:54 PM

## 2017-02-03 NOTE — Lactation Note (Signed)
This note was copied from a baby's chart. Lactation Consultation Note: Mom reports baby is latching well and he fed a lot through the night. He is asleep now and she does not want to wake him now. Reports some pain with initial latch that eases off after a few minutes. Suggested massage and hand expression before latching to get milk flowing for him. Reports breasts are feeling a little fuller this morning. Pumped for 6 months with first baby who was born at [address],. Reviewed engorgement prevention and treatment. Has manual pump. No questions at present. To call prn  Patient Name: Jacqueline [name] WUJWJ'XToday's Date: 02/03/2017 Reason for consult: Follow-up assessment   Maternal Data Formula Feeding for Exclusion: No Has patient been taught Hand Expression?: Yes Does the patient have breastfeeding experience prior to this delivery?: Yes  Feeding Feeding Type: Breast Fed Length of feed: 0 min  LATCH Score                   Interventions Interventions: Hand express;Coconut oil  Lactation Tools Discussed/Used WIC Program: Yes   Consult Status Consult Status: Complete    Pamelia HoitWeeks, Jahari Billy D 02/03/2017, 7:39 AM

## 2017-02-08 ENCOUNTER — Ambulatory Visit: Payer: Self-pay | Admitting: Obstetrics and Gynecology

## 2017-02-09 ENCOUNTER — Ambulatory Visit: Payer: Self-pay | Admitting: Student

## 2017-03-06 ENCOUNTER — Encounter: Payer: Self-pay | Admitting: General Practice

## 2017-03-06 ENCOUNTER — Ambulatory Visit: Payer: Self-pay | Admitting: Advanced Practice Midwife

## 2017-09-22 IMAGING — US US MFM OB COMP +14 WKS
1 series · 14 of 28 positions shown · non-contrast
Comparison: none

[Series 1: us mfm ob comp +14 wks · 103 acquisitions, 14 frames shown]
[im 4/103]
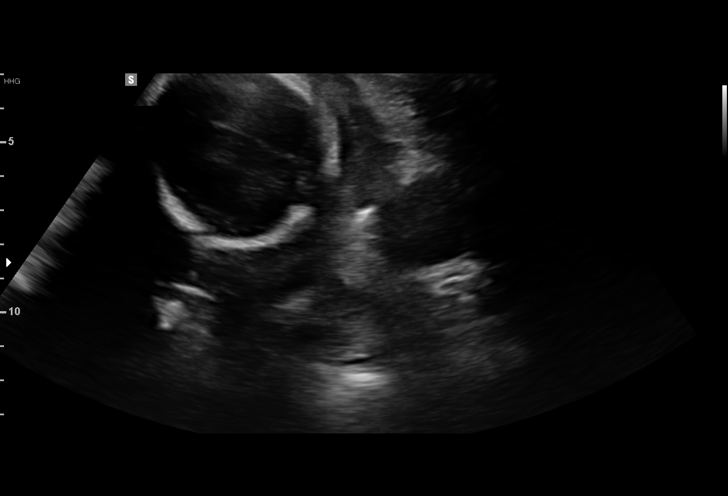
[im 12/103]
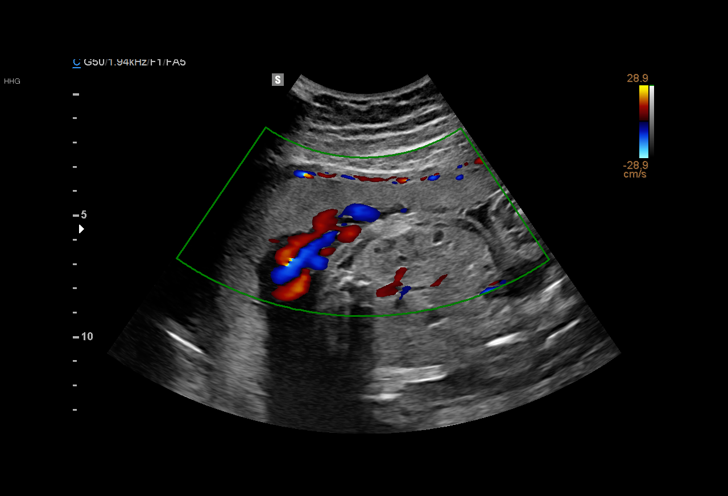
[im 19/103]
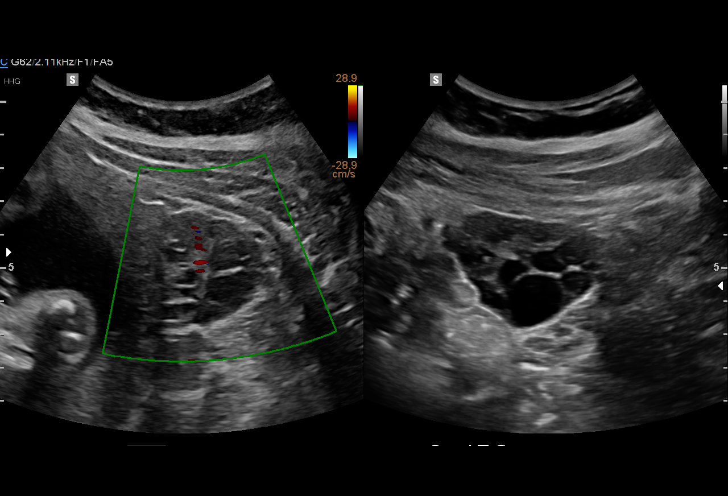
[im 27/103]
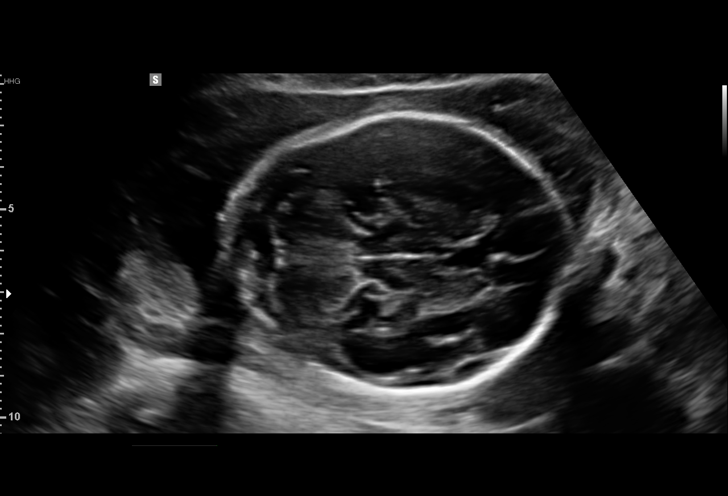
[im 35/103]
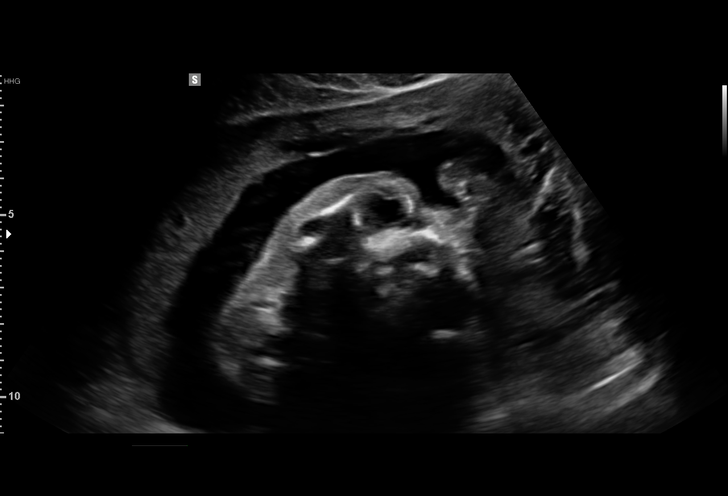
[im 42/103]
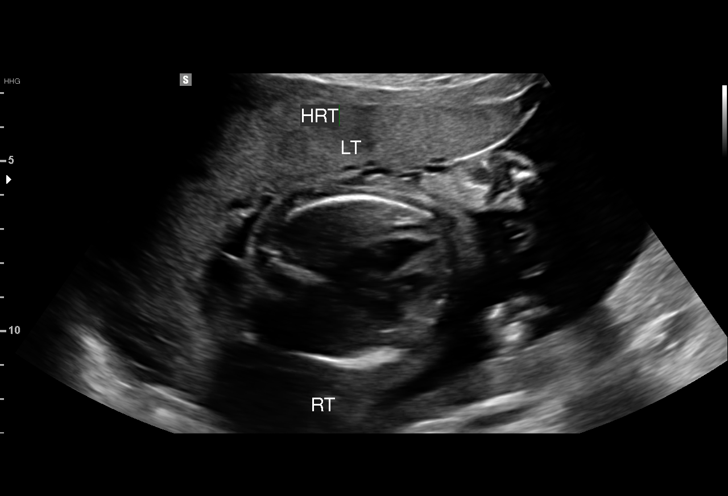
[im 50/103]
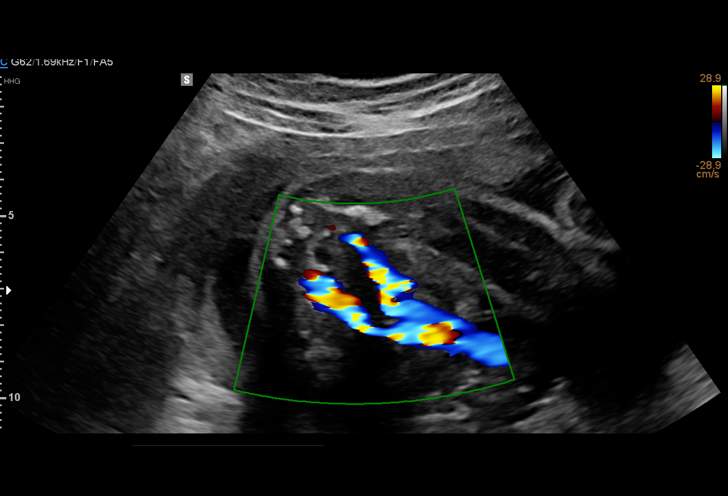
[im 57/103]
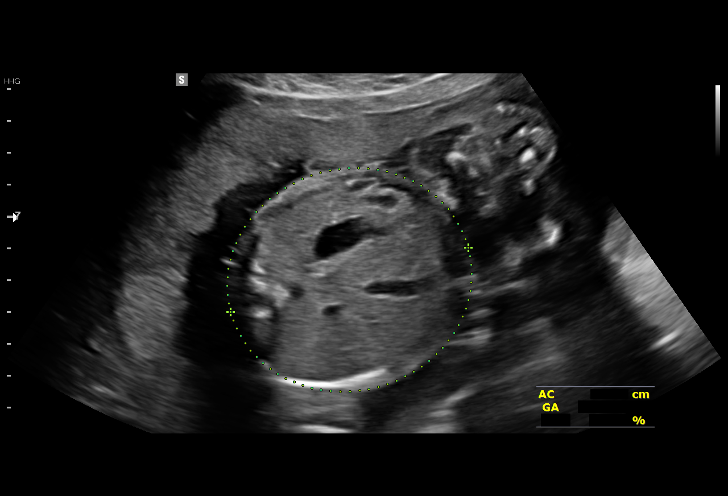
[im 65/103]
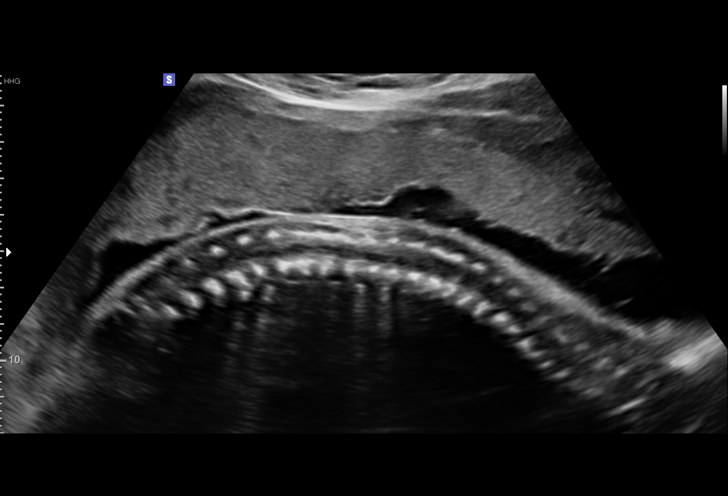
[im 72/103]
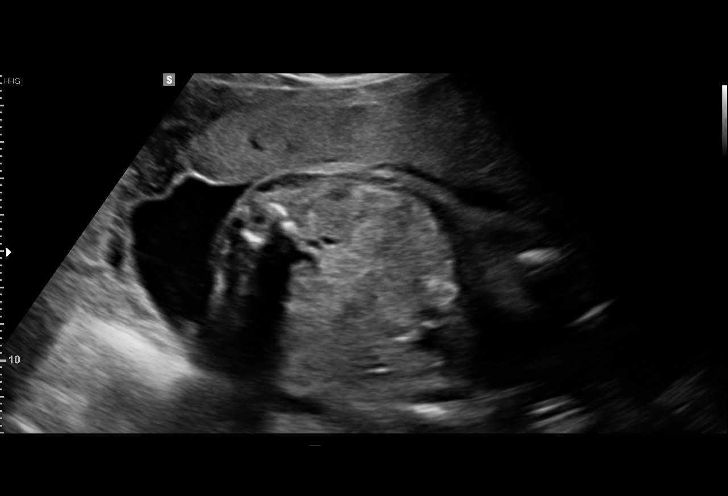
[im 80/103]
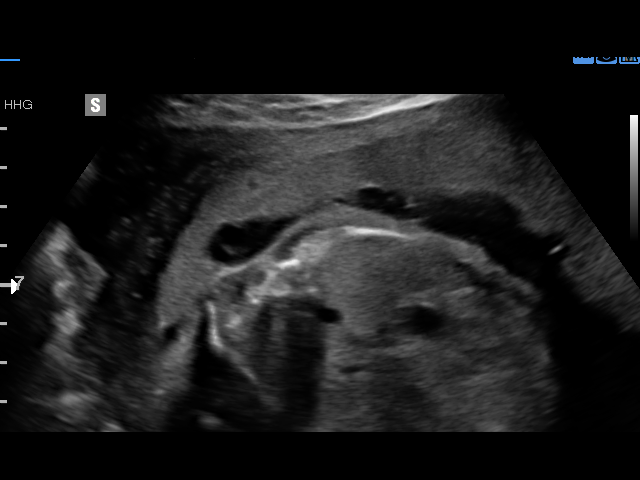
[im 87/103]
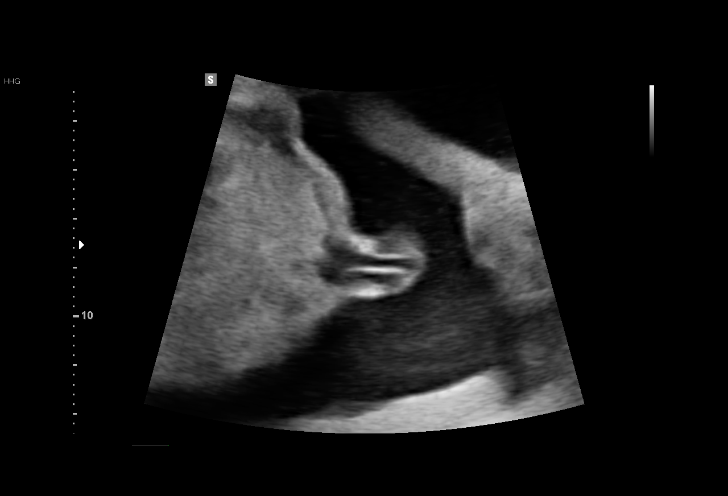
[im 95/103]
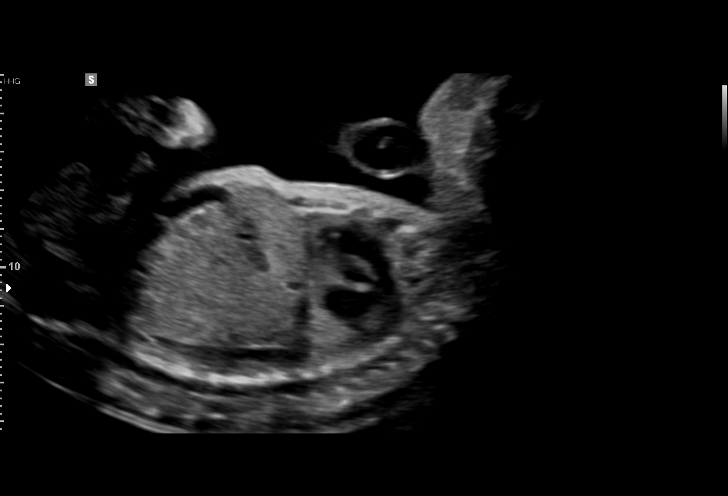
[im 103/103]
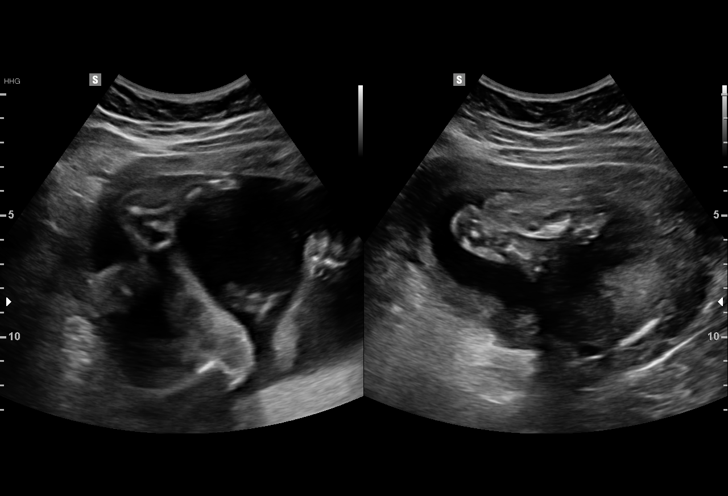

[14 of 28 positions shown; findings below may reference images not displayed]

Indications

26 weeks gestation of pregnancy
Basic anatomic survey                          Z36
OB History

Gravidity:    1
Fetal Evaluation

Num Of Fetuses:     1
Fetal Heart         134
Rate(bpm):
Cardiac Activity:   Observed
Presentation:       Cephalic
Placenta:           Anterior, above cervical os
P. Cord Insertion:  Visualized, central

Amniotic Fluid
AFI FV:      Subjectively within normal limits

Largest Pocket(cm)
6.25
Biometry

BPD:      67.1  mm     G. Age:  27w 0d         66  %    CI:        71.88   %   70 - 86
FL/HC:      19.9   %   18.6 -
HC:      251.9  mm     G. Age:  27w 3d         61  %    HC/AC:      1.09       1.04 -
AC:      231.8  mm     G. Age:  27w 4d         78  %    FL/BPD:     74.7   %   71 - 87
FL:       50.1  mm     G. Age:  27w 0d         56  %    FL/AC:      21.6   %   20 - 24
HUM:        47  mm     G. Age:  27w 5d         77  %
CM:        5.5  mm
Est. FW:    1175  gm      2 lb 5 oz     74  %
Gestational Age

LMP:           26w 2d       Date:   11/25/14                 EDD:   09/01/15
U/S Today:     27w 2d                                        EDD:   08/25/15
Best:          26w 2d    Det. By:   LMP  (11/25/14)          EDD:   09/01/15
Anatomy

Cranium:               Appears normal         Aortic Arch:            Not well visualized
Cavum:                 Appears normal         Ductal Arch:            Appears normal
Ventricles:            Appears normal         Diaphragm:              Appears normal
Choroid Plexus:        Appears normal         Stomach:                Appears normal, left
sided
Cerebellum:            Appears normal         Abdomen:                Appears normal
Posterior Fossa:       Appears normal         Abdominal Wall:         Appears nml (cord
insert, abd wall)
Nuchal Fold:           Not applicable (>20    Cord Vessels:           Appears normal (3
wks GA)                                        vessel cord)
Face:                  Appears normal         Kidneys:                Appear normal
(orbits and profile)
Lips:                  Appears normal         Bladder:                Appears normal
Thoracic:              Appears normal         Spine:                  Appears normal
Heart:                 Appears normal         Upper Extremities:      Appears normal
(4CH, axis, and
situs)
RVOT:                  Appears normal         Lower Extremities:      Appears normal
LVOT:                  Appears normal

Other:  Fetus appears to be a male. Nasal bone visualized. Technically
difficult due to advanced GA and fetal position.
Cervix Uterus Adnexa

Cervix
Length:            3.4  cm.
Normal appearance by transabdominal scan.

Uterus
No abnormality visualized.

Left Ovary
Size(cm)       4.6 x    3.4    x  3.1       Vol(ml):
Within normal limits.

Right Ovary
Size(cm)       4.4 x    2.9    x  2.1       Vol(ml): 14
Within normal limits.

Adnexa:       No adnexal mass visualized.
Impression

Singleton intrauterine pregnancy at 26 weeks 2 days
gestation with fetal cardiac activity
Cephalic presentation
Anterior placenta without evidence of previa
Normal appearing fetal growth and amniotic fluid volume
No apparent birth defects noted on fetal anatomic survey
although aortic arch was not well visualized
Normal appearing cervical length
Recommendations

Recommend follow-up ultrasound examination in 
4 weeks

## 2017-11-24 IMAGING — US US MFM FETAL BPP W/O NON-STRESS
1 series · 13 of 28 positions shown · non-contrast
Comparison: none

1  NAZARETH JUMPER            [id number]      [id number]     [id number]
2  ERCULIANO RATO            [id number]      [id number]     [id number]
Indications

35 weeks gestation of pregnancy
Hypertension - Gestational (Torlei meds)
OB History
Gravidity:    1
Fetal Evaluation
Num Of Fetuses:     1
Fetal Heart         146
Rate(bpm):
Cardiac Activity:   Observed
Presentation:       Cephalic
Placenta:           Anterior, above cervical os
P. Cord Insertion:  Previously Visualized
Amniotic Fluid
AFI FV:      Subjectively within normal limits
AFI Sum(cm)     %Tile       Largest Pocket(cm)
11.42           31
RUQ(cm)       RLQ(cm)       LUQ(cm)        LLQ(cm)
2.83
Biophysical Evaluation
Amniotic F.V:   Within normal limits       F. Tone:        Observed
F. Movement:    Observed                   Score:          [DATE]
F. Breathing:   Observed
Biometry
BPD:      88.6  mm     G. Age:  35w 6d         69  %    CI:        76.14   %   70 - 86
FL/HC:      20.8   %   20.1 -
HC:      321.8  mm     G. Age:  36w 3d         43  %    HC/AC:      1.00       0.93 -
AC:      320.5  mm     G. Age:  36w 0d         76  %    FL/BPD:     75.4   %   71 - 87
FL:       66.8  mm     G. Age:  34w 3d         22  %    FL/AC:      20.8   %   20 - 24
HUM:        60  mm     G. Age:  34w 6d         55  %
Est. FW:    7333  gm          6 lb      68  %
Gestational Age
LMP:           35w 2d       Date:   11/25/14                 EDD:   09/01/15
U/S Today:     35w 5d                                        EDD:   08/29/15
Best:          35w 2d    Det. By:   LMP  (11/25/14)          EDD:   09/01/15
Anatomy
Cranium:               Appears normal         Aortic Arch:            Appears normal
Cavum:                 Appears normal         Ductal Arch:            Previously seen
Ventricles:            Appears normal         Diaphragm:              Appears normal
Choroid Plexus:        Appears normal         Stomach:                Appears normal, left
sided
Cerebellum:            Appears normal         Abdomen:                Appears normal
Posterior Fossa:       Appears normal         Abdominal Wall:         Previously seen
Nuchal Fold:           Not applicable (>20    Cord Vessels:           Appears normal (3
wks GA)                                        vessel cord)
Face:                  Appears normal         Kidneys:                Appear normal
(orbits and profile)
Lips:                  Appears normal         Bladder:                Appears normal
Thoracic:              Appears normal         Spine:                  Previously seen
Heart:                 Previously seen        Upper Extremities:      Previously seen
RVOT:                  Previously seen        Lower Extremities:      Previously seen
LVOT:                  Previously seen
Other:  Male gender previously seen. Nasal bone visualized. Technically
difficult due to advanced GA and fetal position.
Cervix Uterus Adnexa
Cervix
Not visualized (advanced GA >76wks)
Uterus
No abnormality visualized.
Left Ovary
See comments.
Right Ovary
Cul De Sac:   No free fluid seen.
Adnexa:       No abnormality visualized.
Impression
INDICATION: 29 yr old G1P0 at 06w2d with gestational
hypertension for fetal growth and complete anatomic survey.

[Series 1: us mfm fetal bpp w/o non-stress · 13 of 62 slices shown]
[im 3/62]
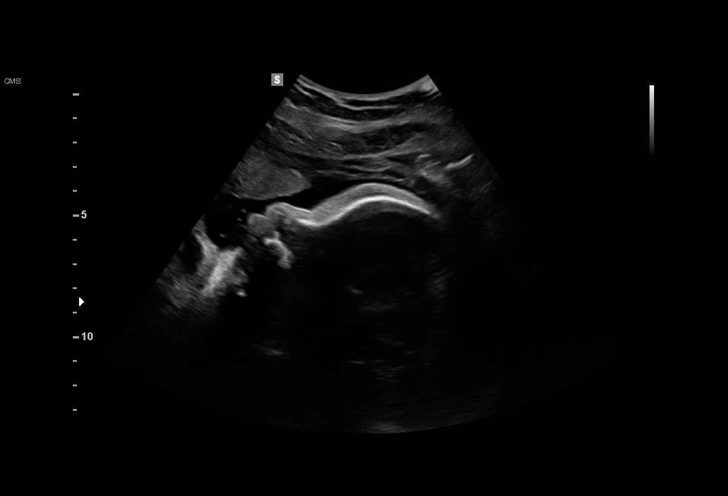
[im 7/62]
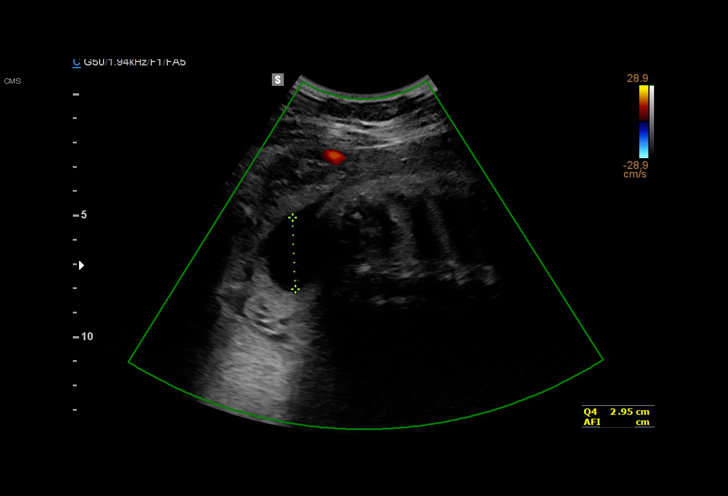
[im 12/62]
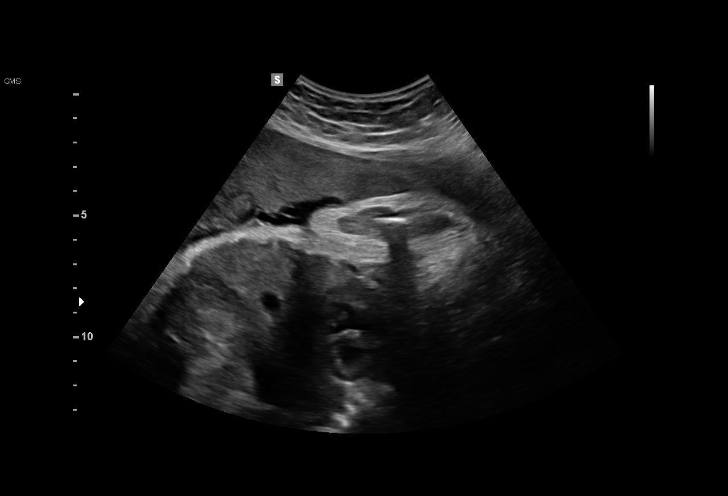
[im 16/62]
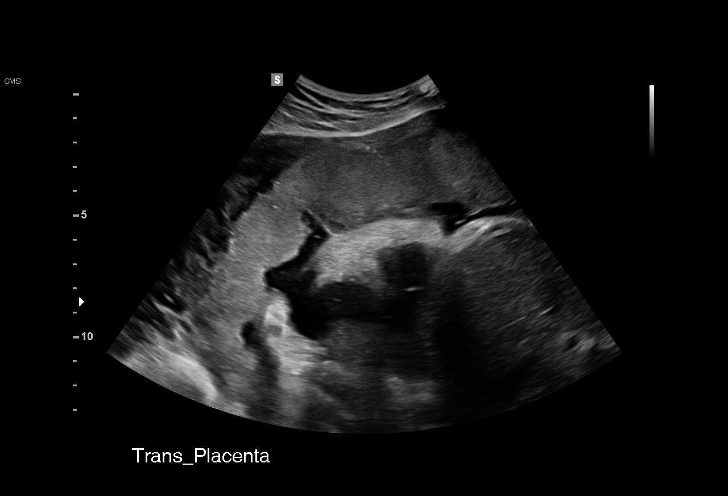
[im 21/62]
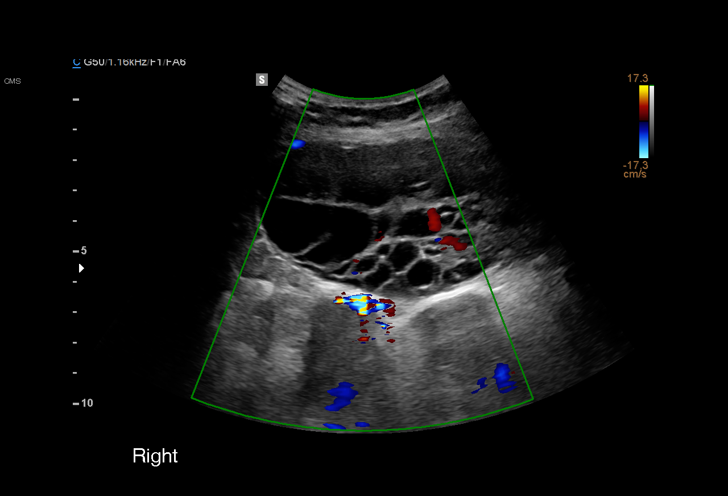
[im 25/62]
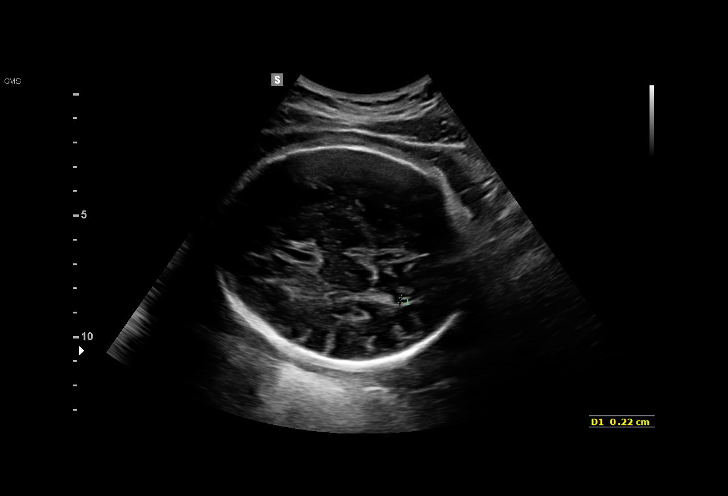
[im 32/62]
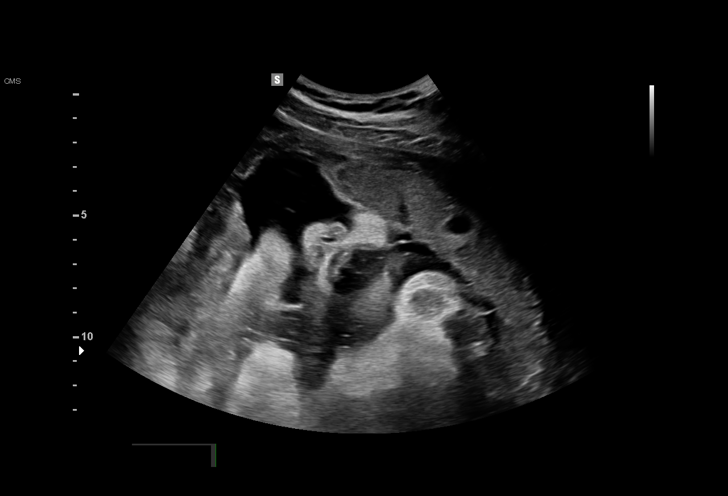
[im 37/62]
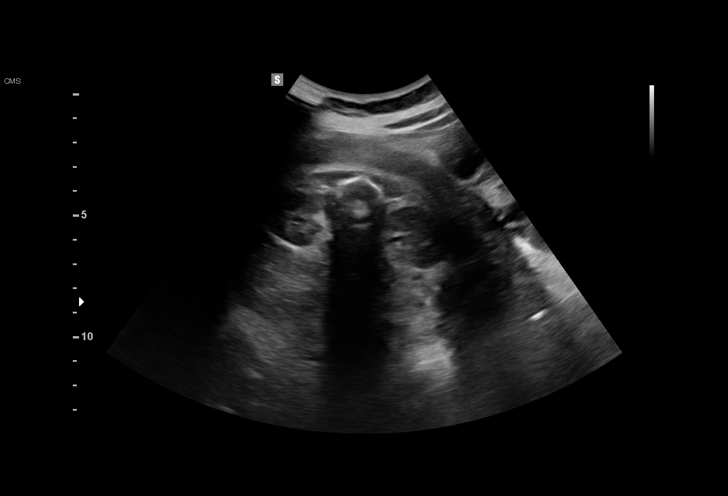
[im 41/62]
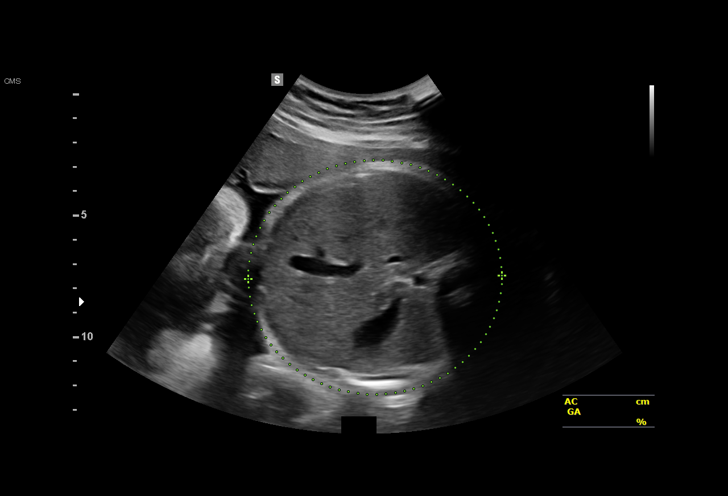
[im 46/62]
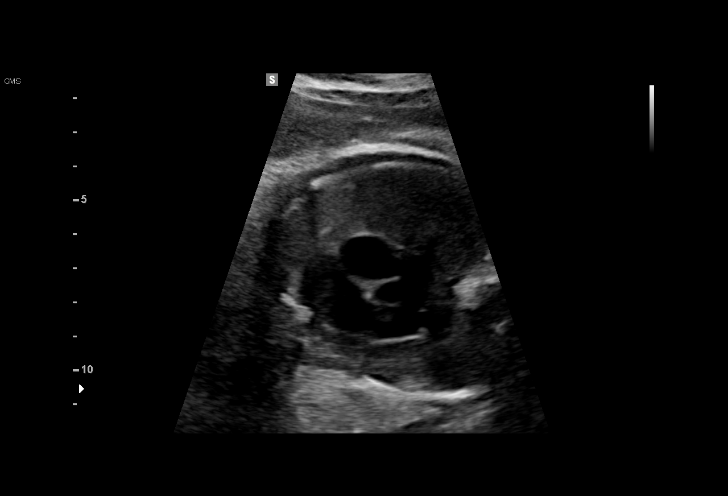
[im 50/62]
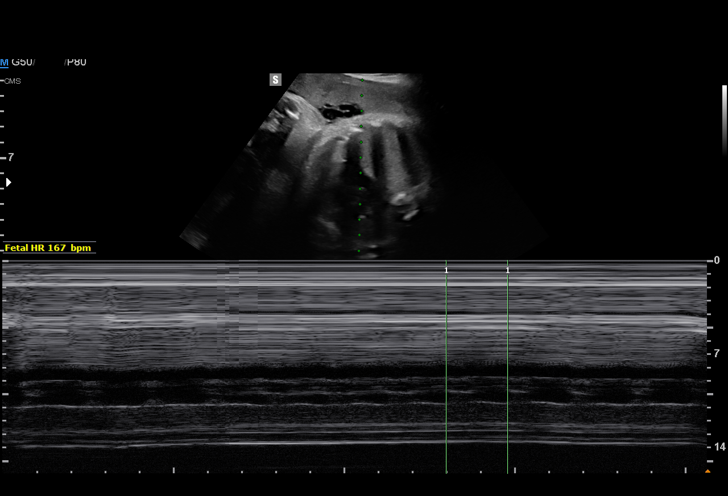
[im 55/62]
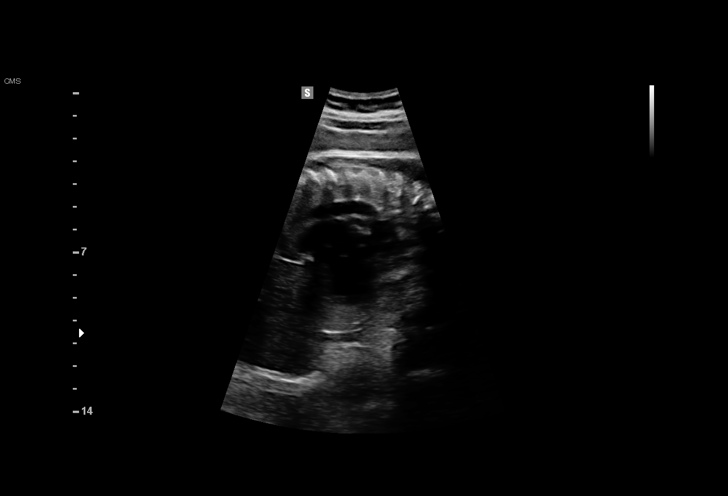
[im 59/62]
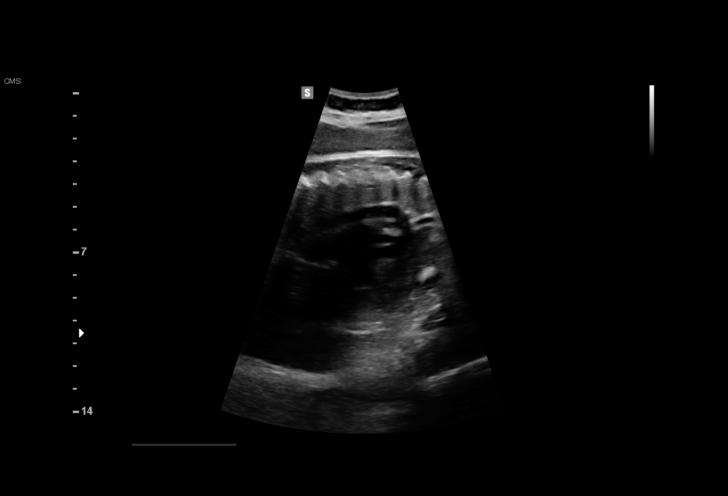

[13 of 28 positions shown; findings below may reference images not displayed]

FINDINGS: 1. Single intrauterine pregnancy.
2. Estimated fetal weight is in the 68th%.
3. Anterior placenta without evidence of previa.
4. Normal amniotic fluid index.
5. The limited anatomy survey is normal; any anatomy not
evaluated on today's exam was evaluated on the previous
exam.
6. Normal biophysical profile of [DATE].
7. The ovaries are enlarged bilaterally with multiple
cysts/follicles seen.
Recommendations

1. Appropriate fetal growth.
2. Fetal anatomic survey is complete.
3. Gestational hypertension:
- no consult requested
- management per primary OB
- asymptomatic today with BP 125/75
- reassuring fetal testing today
4. Recommend reevaluate ovaries postpartum
